# Patient Record
Sex: Male | Born: 1970 | Race: White | Hispanic: No | Marital: Married | Smoking: Former smoker
Health system: Southern US, Community
[De-identification: ages and names within clinical notes are randomized; demographics above are authoritative.]

## PROBLEM LIST (undated history)

## (undated) ENCOUNTER — Emergency Department (HOSPITAL_COMMUNITY): Admission: EM | Payer: Self-pay | Source: Home / Self Care

## (undated) DIAGNOSIS — F4310 Post-traumatic stress disorder, unspecified: Secondary | ICD-10-CM

## (undated) DIAGNOSIS — F32A Depression, unspecified: Secondary | ICD-10-CM

## (undated) DIAGNOSIS — F419 Anxiety disorder, unspecified: Secondary | ICD-10-CM

## (undated) DIAGNOSIS — R51 Headache: Secondary | ICD-10-CM

## (undated) DIAGNOSIS — F329 Major depressive disorder, single episode, unspecified: Secondary | ICD-10-CM

## (undated) DIAGNOSIS — R519 Headache, unspecified: Secondary | ICD-10-CM

## (undated) DIAGNOSIS — M549 Dorsalgia, unspecified: Secondary | ICD-10-CM

## (undated) HISTORY — DX: Headache: R51

## (undated) HISTORY — DX: Headache, unspecified: R51.9

## (undated) HISTORY — DX: Depression, unspecified: F32.A

## (undated) HISTORY — DX: Major depressive disorder, single episode, unspecified: F32.9

---

## 2012-08-29 ENCOUNTER — Other Ambulatory Visit (HOSPITAL_BASED_OUTPATIENT_CLINIC_OR_DEPARTMENT_OTHER): Payer: Self-pay | Admitting: Internal Medicine

## 2012-08-29 ENCOUNTER — Ambulatory Visit (HOSPITAL_BASED_OUTPATIENT_CLINIC_OR_DEPARTMENT_OTHER)
Admission: RE | Admit: 2012-08-29 | Discharge: 2012-08-29 | Disposition: A | Discharge status: Home / Self Care | Payer: Medicaid Other | Source: Ambulatory Visit | Attending: Internal Medicine | Admitting: Internal Medicine

## 2012-08-29 DIAGNOSIS — M545 Low back pain, unspecified: Secondary | ICD-10-CM | POA: Insufficient documentation

## 2012-08-29 DIAGNOSIS — M5410 Radiculopathy, site unspecified: Secondary | ICD-10-CM

## 2012-08-29 DIAGNOSIS — M5126 Other intervertebral disc displacement, lumbar region: Secondary | ICD-10-CM | POA: Insufficient documentation

## 2013-01-16 DIAGNOSIS — F4310 Post-traumatic stress disorder, unspecified: Secondary | ICD-10-CM | POA: Insufficient documentation

## 2013-01-16 DIAGNOSIS — F324 Major depressive disorder, single episode, in partial remission: Secondary | ICD-10-CM | POA: Insufficient documentation

## 2013-01-16 DIAGNOSIS — F411 Generalized anxiety disorder: Secondary | ICD-10-CM | POA: Insufficient documentation

## 2013-05-28 DIAGNOSIS — M545 Low back pain, unspecified: Secondary | ICD-10-CM | POA: Insufficient documentation

## 2013-08-13 DIAGNOSIS — R51 Headache: Secondary | ICD-10-CM

## 2013-08-13 DIAGNOSIS — E559 Vitamin D deficiency, unspecified: Secondary | ICD-10-CM | POA: Insufficient documentation

## 2013-08-13 DIAGNOSIS — G8929 Other chronic pain: Secondary | ICD-10-CM | POA: Insufficient documentation

## 2013-10-15 DIAGNOSIS — R7989 Other specified abnormal findings of blood chemistry: Secondary | ICD-10-CM | POA: Insufficient documentation

## 2013-10-15 DIAGNOSIS — E785 Hyperlipidemia, unspecified: Secondary | ICD-10-CM | POA: Insufficient documentation

## 2014-01-26 ENCOUNTER — Emergency Department (HOSPITAL_BASED_OUTPATIENT_CLINIC_OR_DEPARTMENT_OTHER)
Admission: EM | Admit: 2014-01-26 | Discharge: 2014-01-26 | Disposition: A | Discharge status: Home / Self Care | Payer: Medicaid Other | Attending: Emergency Medicine | Admitting: Emergency Medicine

## 2014-01-26 ENCOUNTER — Emergency Department (HOSPITAL_BASED_OUTPATIENT_CLINIC_OR_DEPARTMENT_OTHER): Payer: Medicaid Other

## 2014-01-26 ENCOUNTER — Encounter (HOSPITAL_BASED_OUTPATIENT_CLINIC_OR_DEPARTMENT_OTHER): Payer: Self-pay | Admitting: *Deleted

## 2014-01-26 DIAGNOSIS — M545 Low back pain, unspecified: Secondary | ICD-10-CM

## 2014-01-26 DIAGNOSIS — G8929 Other chronic pain: Secondary | ICD-10-CM | POA: Insufficient documentation

## 2014-01-26 DIAGNOSIS — N508 Other specified disorders of male genital organs: Secondary | ICD-10-CM | POA: Diagnosis not present

## 2014-01-26 DIAGNOSIS — M549 Dorsalgia, unspecified: Secondary | ICD-10-CM | POA: Diagnosis present

## 2014-01-26 DIAGNOSIS — IMO0002 Reserved for concepts with insufficient information to code with codable children: Secondary | ICD-10-CM

## 2014-01-26 DIAGNOSIS — Z8659 Personal history of other mental and behavioral disorders: Secondary | ICD-10-CM | POA: Diagnosis not present

## 2014-01-26 DIAGNOSIS — Z87891 Personal history of nicotine dependence: Secondary | ICD-10-CM | POA: Insufficient documentation

## 2014-01-26 HISTORY — DX: Anxiety disorder, unspecified: F41.9

## 2014-01-26 HISTORY — DX: Post-traumatic stress disorder, unspecified: F43.10

## 2014-01-26 HISTORY — DX: Dorsalgia, unspecified: M54.9

## 2014-01-26 LAB — URINALYSIS, ROUTINE W REFLEX MICROSCOPIC
Bilirubin Urine: NEGATIVE
Glucose, UA: NEGATIVE mg/dL
HGB URINE DIPSTICK: NEGATIVE
Ketones, ur: 15 mg/dL — AB
LEUKOCYTES UA: NEGATIVE
NITRITE: NEGATIVE
PROTEIN: NEGATIVE mg/dL
SPECIFIC GRAVITY, URINE: 1.025 (ref 1.005–1.030)
UROBILINOGEN UA: 0.2 mg/dL (ref 0.0–1.0)
pH: 6 (ref 5.0–8.0)

## 2014-01-26 MED ORDER — PREDNISONE 20 MG PO TABS: ORAL_TABLET | ORAL | Status: DC

## 2014-01-26 MED ORDER — OXYCODONE-ACETAMINOPHEN 5-325 MG PO TABS: 2.0000 | ORAL_TABLET | ORAL | Status: DC | PRN

## 2014-01-26 MED ORDER — OXYCODONE-ACETAMINOPHEN 5-325 MG PO TABS
2.0000 | ORAL_TABLET | Freq: Once | ORAL | Status: AC
  Administered 2014-01-26: 2 via ORAL
  Filled 2014-01-26: qty 2

## 2014-01-26 NOTE — ED Notes (Signed)
Pt reports hx of back pain- states has been worse for last few weeks and worse this am

## 2014-01-26 NOTE — ED Provider Notes (Signed)
CSN: [id number]     Arrival date & time 01/26/14  1315 History   First MD Initiated Contact with Patient 01/26/14 1346     Chief Complaint  Patient presents with  . Back Pain     (Consider location/radiation/quality/duration/timing/severity/associated sxs/prior Treatment) HPI   43 year old male with history of chronic back pain who presents complaining of back pain and scrotal pain. Patient reports he has always had chronic low back pain which seems to be worsening in the past few weeks. This morning he was awoke with intense pain from his low back radiates around his corns and now into his scrotum. Pain is severe, he was having difficulty getting out of bed, sitting position may have been worsening his pain. This pain is different from his usual pain. There is no associated fever, chills, nausea vomiting diarrhea, dysuria, hematuria, bowel bladder incontinence, saddle paresthesia, or rash denies any new numbness or weakness. Patient states he has been evaluated for his low back pain multiple times in the past including MRI and was told that he has degenerative disc and bulging disc. He is currently not on any medication for his back.  Past Medical History  Diagnosis Date  . Back pain   . Anxiety   . PTSD (post-traumatic stress disorder)    History reviewed. No pertinent past surgical history. No family history on file. History  Substance Use Topics  . Smoking status: Former Games developermoker  . Smokeless tobacco: Current User    Types: Snuff  . Alcohol Use: No    Review of Systems  Constitutional: Negative for fever.  Genitourinary: Negative for scrotal swelling.  Musculoskeletal: Positive for back pain.  Neurological: Negative for numbness.       Allergies  Review of patient's allergies indicates no known allergies.  Home Medications   Prior to Admission medications   Not on File   BP 128/77 mmHg  Pulse 97  Temp(Src) 98.1 F (36.7 C) (Oral)  Resp 20  Ht 6\' 1"  (1.854 m)   Wt 260 lb (117.935 kg)  BMI 34.31 kg/m2  SpO2 97% Physical Exam  Constitutional: He appears well-developed and well-nourished. No distress.  HENT:  Head: Atraumatic.  Eyes: Conjunctivae are normal.  Neck: Normal range of motion. Neck supple.  Cardiovascular: Intact distal pulses.   Abdominal: There is tenderness (guarding or rebound tenderness. Negative Murphy sign, no pain at McBurney's point.).  No cva tenderness  Genitourinary:  Chaperone present: No inguinal hernia, normal penile shaft with a circumcised penis without rash. Testicles are tender bilaterally without any significant swelling or mass. Testicle with normal lies.  Cremasteric reflex intact bilat.   No   Musculoskeletal: He exhibits tenderness (Tenderness to lumbar midline without crepitus or step-off. Paralumbar tenderness on palpation, no overlying skin changes.). He exhibits no edema.  Neurological: He is alert. He has normal reflexes.  Skin: No rash noted.  Psychiatric: He has a normal mood and affect.    ED Course  Procedures (including critical care time)  2:15 PM   Acute on chronic low back pain without red flags. No inguinal lymphadenopathy but patient does complain of pain to testicle bilaterally on palpation. Otherwise no suspicion for testicular torsion , plan to obtain a scrotal ultrasound to rule out acute emergent condition. X-ray of low back ordered, pain medication given.  3:57 PM  scrotal ultrasound without any acute finding , UA shows no evidence of urinary tract infection , L-spine x-ray is normal. Patient able to ambulate and no obvious red flags  as discussed earlier. He denies having any significant bowel bladder problem. At this time no suspicion for colitis, diverticulitis, appendicitis, or other acute emergent condition. I do not think MRI of his spine is appropriate at this time. Given that he described some radicular pain down to his left lower leg my plan is to provide him pain medication,  steroid, and a close follow-up with neurosurgeon for further care. Strict return precautions discussed.  Labs Review Labs Reviewed  URINALYSIS, ROUTINE W REFLEX MICROSCOPIC - Abnormal; Notable for the following:    Ketones, ur 15 (*)    All other components within normal limits    Imaging Review Dg Lumbar Spine Complete  01/26/2014   CLINICAL DATA:  Low back pain initiated for 1 day. Numbness going down the legs.  EXAM: LUMBAR SPINE - COMPLETE 4+ VIEW  COMPARISON:  MRI 08/29/2012  FINDINGS: Normal alignment of lumbar vertebral bodies. No loss of vertebral body height or disc height. No pars fracture. No subluxation.  IMPRESSION: Normal lumbar radiographs   Electronically Signed   By: Genevive Bi M.D.   On: 01/26/2014 14:29   US Scrotum  01/26/2014   CLINICAL DATA:  43 year old male with scrotal pain for 2 months.  EXAM: SCROTAL ULTRASOUND  DOPPLER ULTRASOUND OF THE TESTICLES  TECHNIQUE: Complete ultrasound examination of the testicles, epididymis, and other scrotal structures was performed. Color and spectral Doppler ultrasound were also utilized to evaluate blood flow to the testicles.  COMPARISON:  None.  FINDINGS: Right testicle  Measurements: 5 x 2.4 x 3.3 cm. No mass or microlithiasis visualized.  Left testicle  Measurements: 4.7 x 2.5 x 3.3 cm. No mass or microlithiasis visualized.  Right epididymis:  Normal in size and appearance.  Left epididymis:  A 5 x 7 mm epididymal cyst/ spermatocele is noted.  Hydrocele:  A small left hydrocele is noted.  Varicocele:  None visualized.  Pulsed Doppler interrogation of both testes demonstrates low resistance arterial and venous waveforms bilaterally.  IMPRESSION: Normal testicles.  No evidence of testicular torsion or mass.  Small left hydrocele.   Electronically Signed   By: Laveda Abbe M.D.   On: 01/26/2014 14:55   [place] Art/ven Flow Abd Pelv Doppler  01/26/2014   CLINICAL DATA:  43 year old male with scrotal pain for 2 months.  EXAM: SCROTAL  ULTRASOUND  DOPPLER ULTRASOUND OF THE TESTICLES  TECHNIQUE: Complete ultrasound examination of the testicles, epididymis, and other scrotal structures was performed. Color and spectral Doppler ultrasound were also utilized to evaluate blood flow to the testicles.  COMPARISON:  None.  FINDINGS: Right testicle  Measurements: 5 x 2.4 x 3.3 cm. No mass or microlithiasis visualized.  Left testicle  Measurements: 4.7 x 2.5 x 3.3 cm. No mass or microlithiasis visualized.  Right epididymis:  Normal in size and appearance.  Left epididymis:  A 5 x 7 mm epididymal cyst/ spermatocele is noted.  Hydrocele:  A small left hydrocele is noted.  Varicocele:  None visualized.  Pulsed Doppler interrogation of both testes demonstrates low resistance arterial and venous waveforms bilaterally.  IMPRESSION: Normal testicles.  No evidence of testicular torsion or mass.  Small left hydrocele.   Electronically Signed   By: Laveda Abbe M.D.   On: 01/26/2014 14:55     EKG Interpretation None      MDM   Final diagnoses:  Testicular/scrotal pain  Low back pain    BP 128/77 mmHg  Pulse 97  Temp(Src) 98.1 F (36.7 C) (Oral)  Resp 20  Ht 6\' 1"  (1.854 m)  Wt 260 lb (117.935 kg)  BMI 34.31 kg/m2  SpO2 97%  I have reviewed nursing notes and vital signs. I personally reviewed the imaging tests through PACS system  I reviewed available ER/hospitalization records thought the EMR     Fayrene HelperBowie Desma Wilkowski, PA-C 01/26/14 1608  Gwyneth SproutWhitney Plunkett, MD 02/03/14 2255

## 2014-01-26 NOTE — Discharge Instructions (Signed)

## 2014-01-30 ENCOUNTER — Emergency Department (HOSPITAL_BASED_OUTPATIENT_CLINIC_OR_DEPARTMENT_OTHER)
Admission: EM | Admit: 2014-01-30 | Discharge: 2014-01-30 | Disposition: A | Discharge status: Home / Self Care | Payer: Medicaid Other | Attending: Emergency Medicine | Admitting: Emergency Medicine

## 2014-01-30 ENCOUNTER — Encounter (HOSPITAL_BASED_OUTPATIENT_CLINIC_OR_DEPARTMENT_OTHER): Payer: Self-pay

## 2014-01-30 DIAGNOSIS — Z8659 Personal history of other mental and behavioral disorders: Secondary | ICD-10-CM | POA: Insufficient documentation

## 2014-01-30 DIAGNOSIS — M545 Low back pain, unspecified: Secondary | ICD-10-CM

## 2014-01-30 DIAGNOSIS — Z87891 Personal history of nicotine dependence: Secondary | ICD-10-CM | POA: Insufficient documentation

## 2014-01-30 DIAGNOSIS — Z7952 Long term (current) use of systemic steroids: Secondary | ICD-10-CM | POA: Diagnosis not present

## 2014-01-30 MED ORDER — OXYCODONE-ACETAMINOPHEN 5-325 MG PO TABS: 2.0000 | ORAL_TABLET | ORAL | Status: DC | PRN

## 2014-01-30 MED ORDER — OXYCODONE-ACETAMINOPHEN 5-325 MG PO TABS: 1.0000 | ORAL_TABLET | Freq: Four times a day (QID) | ORAL | Status: DC | PRN

## 2014-01-30 NOTE — ED Provider Notes (Signed)
CSN: [id number]     Arrival date & time 01/30/14  1548 History   First MD Initiated Contact with Patient 01/30/14 1604     No chief complaint on file.    (Consider location/radiation/quality/duration/timing/severity/associated sxs/prior Treatment) HPI Complains of low back pain radiating to his scrotum becoming worse 2 weeks ago. No injury no fever no loss of bladder or bowel control. Patient suffers from chronic back pain but has become worse over the past 2 weeks. Seen here on 01/26/2014 prescribed Percocet and prednisone. He has run out of Percocet he has one day of prednisone in tapered dose. Patient has follow-up appointment scheduled with neurosurgeon for Monday, 02/04/2014. Pain is worse with changing positions improved with standing straight. Patient's evaluation on 01/26/2014 consisted of plain radiographs of lumbar spine, scrotal ultrasound and urinalysis Past Medical History  Diagnosis Date  . Back pain   . Anxiety   . PTSD (post-traumatic stress disorder)    History reviewed. No pertinent past surgical history. No family history on file. History  Substance Use Topics  . Smoking status: Former Games developermoker  . Smokeless tobacco: Current User    Types: Chew  . Alcohol Use: No   no illicit drug use  Review of Systems  Musculoskeletal: Positive for back pain.  All other systems reviewed and are negative.     Allergies  Review of patient's allergies indicates no known allergies.  Home Medications   Prior to Admission medications   Medication Sig Start Date End Date Taking? Authorizing Provider  oxyCODONE-acetaminophen (PERCOCET/ROXICET) 5-325 MG per tablet Take 2 tablets by mouth every 4 (four) hours as needed for moderate pain or severe pain. 01/26/14   Fayrene HelperBowie Tran, PA-C  predniSONE (DELTASONE) 20 MG tablet 3 tabs po day one, then 2 po daily x 4 days 01/26/14   Fayrene HelperBowie Tran, PA-C   BP 139/96 mmHg  Pulse 100  Temp(Src) 97.8 F (36.6 C) (Oral)  Resp 18  Ht 6\' 1"  (1.854 m)   Wt 260 lb (117.935 kg)  BMI 34.31 kg/m2  SpO2 96% Physical Exam  Constitutional: He is oriented to person, place, and time. He appears well-developed and well-nourished. He appears distressed.  Appears uncomfortable  HENT:  Head: Normocephalic and atraumatic.  Eyes: Conjunctivae are normal. Pupils are equal, round, and reactive to light.  Neck: Neck supple. No tracheal deviation present. No thyromegaly present.  Cardiovascular: Normal rate and regular rhythm.   No murmur heard. Pulmonary/Chest: Effort normal and breath sounds normal.  Abdominal: Soft. Bowel sounds are normal. He exhibits no distension. There is no tenderness.  Musculoskeletal: Normal range of motion. He exhibits no edema or tenderness.  No point tenderness along spine. He does complain of pain at lower back when he stands up from a seated position.  Neurological: He is alert and oriented to person, place, and time. No cranial nerve deficit. Coordination normal.  Gait normal. DTRs symmetric bilaterally at knee jerk ankle jerk biceps disorder bilaterally  Skin: Skin is warm and dry. No rash noted.  Psychiatric: He has a normal mood and affect.  Nursing note and vitals reviewed.   ED Course  Procedures (including critical care time) Labs Review Labs Reviewed - No data to display  Imaging Review No results found.   EKG Interpretation None      MDM  Plan patient does not require further doses of steroid. Prescription for Percocet. He is to keep his scheduled plan with neurosurgeon on 02/04/2014. Patient has chronic back pain with acute on chronic  component Diagnosis low back pain Final diagnoses:  None        Doug SouSam Blenda Wisecup, MD 01/30/14 [phone]

## 2014-01-30 NOTE — ED Notes (Signed)
C/o cont'd lower back pain x 2 weeks-denies injury-seen here 11/21-out of meds-has appt with neuro next Monday

## 2014-01-30 NOTE — Discharge Instructions (Signed)
Keep your scheduled appointment with neurosurgeon on 02/04/2014. Take her Advil for mild pain or the pain medicine prescribed for bad pain

## 2014-05-06 ENCOUNTER — Emergency Department (HOSPITAL_BASED_OUTPATIENT_CLINIC_OR_DEPARTMENT_OTHER)
Admission: EM | Admit: 2014-05-06 | Discharge: 2014-05-07 | Disposition: A | Discharge status: Home / Self Care | Payer: Medicaid Other | Attending: Emergency Medicine | Admitting: Emergency Medicine

## 2014-05-06 ENCOUNTER — Encounter (HOSPITAL_BASED_OUTPATIENT_CLINIC_OR_DEPARTMENT_OTHER): Payer: Self-pay | Admitting: *Deleted

## 2014-05-06 DIAGNOSIS — M545 Low back pain: Secondary | ICD-10-CM | POA: Diagnosis not present

## 2014-05-06 DIAGNOSIS — Z87891 Personal history of nicotine dependence: Secondary | ICD-10-CM | POA: Insufficient documentation

## 2014-05-06 DIAGNOSIS — R109 Unspecified abdominal pain: Secondary | ICD-10-CM | POA: Insufficient documentation

## 2014-05-06 DIAGNOSIS — R52 Pain, unspecified: Secondary | ICD-10-CM

## 2014-05-06 NOTE — ED Notes (Signed)
Pt c/o lower back pain which radiates down left leg x 1 day

## 2014-05-07 ENCOUNTER — Emergency Department (HOSPITAL_BASED_OUTPATIENT_CLINIC_OR_DEPARTMENT_OTHER): Payer: Medicaid Other

## 2014-05-07 LAB — URINALYSIS, ROUTINE W REFLEX MICROSCOPIC
GLUCOSE, UA: NEGATIVE mg/dL
Hgb urine dipstick: NEGATIVE
KETONES UR: 15 mg/dL — AB
Leukocytes, UA: NEGATIVE
Nitrite: NEGATIVE
PH: 5.5 (ref 5.0–8.0)
Protein, ur: NEGATIVE mg/dL
Specific Gravity, Urine: 1.026 (ref 1.005–1.030)
Urobilinogen, UA: 0.2 mg/dL (ref 0.0–1.0)

## 2014-05-07 MED ORDER — OXYCODONE-ACETAMINOPHEN 5-325 MG PO TABS: 1.0000 | ORAL_TABLET | Freq: Four times a day (QID) | ORAL | Status: DC | PRN

## 2014-05-07 MED ORDER — ONDANSETRON HCL 4 MG/2ML IJ SOLN
4.0000 mg | Freq: Once | INTRAMUSCULAR | Status: AC
  Administered 2014-05-07: 4 mg via INTRAMUSCULAR
  Filled 2014-05-07: qty 2

## 2014-05-07 MED ORDER — HYDROMORPHONE HCL 1 MG/ML IJ SOLN
2.0000 mg | Freq: Once | INTRAMUSCULAR | Status: AC
  Administered 2014-05-07: 2 mg via INTRAMUSCULAR
  Filled 2014-05-07: qty 2

## 2014-05-07 MED ORDER — HYDROXYZINE HCL 25 MG PO TABS
25.0000 mg | ORAL_TABLET | Freq: Once | ORAL | Status: AC
  Administered 2014-05-07: 25 mg via ORAL

## 2014-05-07 MED ORDER — HYDROXYZINE HCL 10 MG PO TABS
10.0000 mg | ORAL_TABLET | Freq: Once | ORAL | Status: DC
  Filled 2014-05-07: qty 1

## 2014-05-07 MED ORDER — HYDROXYZINE HCL 25 MG PO TABS
ORAL_TABLET | ORAL | Status: AC
  Filled 2014-05-07: qty 1

## 2014-05-07 NOTE — ED Provider Notes (Signed)
Nursing notes and vitals signs, including pulse oximetry, reviewed.  Summary of this visit's results, reviewed by myself:  Labs:  Results for orders placed or performed during the hospital encounter of 05/06/14 (from the past 24 hour(s))  Urinalysis, Routine w reflex microscopic     Status: Abnormal   Collection Time: 05/07/14 12:18 AM  Result Value Ref Range   Color, Urine AMBER (A) YELLOW   APPearance CLOUDY (A) CLEAR   Specific Gravity, Urine 1.026 1.005 - 1.030   pH 5.5 5.0 - 8.0   Glucose, UA NEGATIVE NEGATIVE mg/dL   Hgb urine dipstick NEGATIVE NEGATIVE   Bilirubin Urine SMALL (A) NEGATIVE   Ketones, ur 15 (A) NEGATIVE mg/dL   Protein, ur NEGATIVE NEGATIVE mg/dL   Urobilinogen, UA 0.2 0.0 - 1.0 mg/dL   Nitrite NEGATIVE NEGATIVE   Leukocytes, UA NEGATIVE NEGATIVE    Imaging Studies: Ct Renal Stone Study  05/07/2014   CLINICAL DATA:  Lower back pain radiating down the left leg for 1 day.  EXAM: CT ABDOMEN AND PELVIS WITHOUT CONTRAST  TECHNIQUE: Multidetector CT imaging of the abdomen and pelvis was performed following the standard protocol without IV contrast.  COMPARISON:  None.  FINDINGS: BODY WALL: Medication injection into the left gluteal fat.  LOWER CHEST: Unremarkable.  ABDOMEN/PELVIS:  Liver: No focal abnormality.  Biliary: No evidence of biliary obstruction or stone.  Pancreas: Unremarkable.  Spleen: Unremarkable.  Adrenals: Unremarkable.  Kidneys and ureters: No hydronephrosis or stone.  Bladder: Unremarkable.  Reproductive: Unremarkable.  Bowel: No obstruction. Normal appendix.  Retroperitoneum: No mass or adenopathy.  Peritoneum: No ascites or pneumoperitoneum.  Vascular: No acute abnormality.  OSSEOUS: Degenerative disc narrowing and L5-S1. Compared to 08/29/2012 MRI, no new spinal findings to explain back pain.  IMPRESSION: No hydronephrosis, urolithiasis, or other explanation for acute back pain.   Electronically Signed   By: Marnee SpringJonathon  Watts M.D.   On: 05/07/2014 01:18     Medical screening examination/treatment/procedure(s) were conducted as a shared visit with non-physician practitioner(s) and myself.  I personally evaluated the patient during the encounter.  1:25 AM Pain improved after medications. Patient pacing but in no distress. Advised of CT findings, need to follow up with PCP.     Hanley SeamenJohn L Avelynn Sellin, MD 05/07/14 [phone]

## 2014-05-07 NOTE — ED Provider Notes (Signed)
CSN: [id number]     Arrival date & time 05/06/14  2229 History   First MD Initiated Contact with Patient 05/06/14 2350     Chief Complaint  Patient presents with  . Back Pain     (Consider location/radiation/quality/duration/timing/severity/associated sxs/prior Treatment) Patient is a 44 y.o. male presenting with back pain. The history is provided by the patient. No language interpreter was used.  Back Pain Location:  Lumbar spine Quality:  Aching Radiates to:  Does not radiate Pain severity:  Moderate Pain is:  Worse during the night Onset quality:  Gradual Duration:  1 day Timing:  Constant Progression:  Worsening Chronicity:  New Relieved by:  Nothing Worsened by:  Nothing tried Ineffective treatments:  None tried Associated symptoms: abdominal pain   Associated symptoms: no numbness   Pt reports he has had back pain since his youth.  Pt complains of an exacerbation of pain.  Pt reports he saw neurosurgeon and was told it was not his back but is awaiting MRi. Urologist reported not prostate  Past Medical History  Diagnosis Date  . Back pain   . Anxiety   . PTSD (post-traumatic stress disorder)    History reviewed. No pertinent past surgical history. History reviewed. No pertinent family history. History  Substance Use Topics  . Smoking status: Former Games developermoker  . Smokeless tobacco: Current User    Types: Chew  . Alcohol Use: No    Review of Systems  Gastrointestinal: Positive for abdominal pain.  Musculoskeletal: Positive for back pain.  Neurological: Negative for numbness.  All other systems reviewed and are negative.     Allergies  Review of patient's allergies indicates no known allergies.  Home Medications   Prior to Admission medications   Not on File   BP 111/78 mmHg  Pulse 104  Temp(Src) 98.8 F (37.1 C) (Oral)  Resp 16  Ht 6' (1.829 m)  Wt 260 lb (117.935 kg)  BMI 35.25 kg/m2  SpO2 98% Physical Exam  Constitutional: He appears  well-developed and well-nourished.  HENT:  Head: Normocephalic.  Right Ear: External ear normal.  Left Ear: External ear normal.  Eyes: Pupils are equal, round, and reactive to light.  Neck: Normal range of motion.  Cardiovascular: Normal rate.   Pulmonary/Chest: Effort normal.  Abdominal: Soft.  Musculoskeletal: He exhibits tenderness.  Tender ls spine   Neurological: He is alert.  Skin: Skin is warm.  Nursing note and vitals reviewed.   ED Course  Procedures (including critical care time) Labs Review Labs Reviewed  URINALYSIS, ROUTINE W REFLEX MICROSCOPIC - Abnormal; Notable for the following:    Color, Urine AMBER (*)    APPearance CLOUDY (*)    Bilirubin Urine SMALL (*)    Ketones, ur 15 (*)    All other components within normal limits    Imaging Review No results found.   EKG Interpretation None      MDM   Final diagnoses:  Pain        Elson AreasLeslie K Carolin Quang, PA-C 05/07/14 0845

## 2014-05-20 ENCOUNTER — Emergency Department (HOSPITAL_BASED_OUTPATIENT_CLINIC_OR_DEPARTMENT_OTHER): Payer: Medicaid Other

## 2014-05-20 ENCOUNTER — Emergency Department (HOSPITAL_BASED_OUTPATIENT_CLINIC_OR_DEPARTMENT_OTHER)
Admission: EM | Admit: 2014-05-20 | Discharge: 2014-05-21 | Disposition: A | Discharge status: Home / Self Care | Payer: Medicaid Other | Attending: Emergency Medicine | Admitting: Emergency Medicine

## 2014-05-20 ENCOUNTER — Encounter (HOSPITAL_BASED_OUTPATIENT_CLINIC_OR_DEPARTMENT_OTHER): Payer: Self-pay | Admitting: Emergency Medicine

## 2014-05-20 DIAGNOSIS — R109 Unspecified abdominal pain: Secondary | ICD-10-CM | POA: Insufficient documentation

## 2014-05-20 DIAGNOSIS — M549 Dorsalgia, unspecified: Secondary | ICD-10-CM | POA: Insufficient documentation

## 2014-05-20 DIAGNOSIS — G8929 Other chronic pain: Secondary | ICD-10-CM | POA: Diagnosis not present

## 2014-05-20 DIAGNOSIS — Z8659 Personal history of other mental and behavioral disorders: Secondary | ICD-10-CM | POA: Insufficient documentation

## 2014-05-20 DIAGNOSIS — Z87891 Personal history of nicotine dependence: Secondary | ICD-10-CM | POA: Insufficient documentation

## 2014-05-20 MED ORDER — DICYCLOMINE HCL 10 MG/ML IM SOLN
20.0000 mg | Freq: Once | INTRAMUSCULAR | Status: AC
  Administered 2014-05-20: 20 mg via INTRAMUSCULAR
  Filled 2014-05-20: qty 2

## 2014-05-20 MED ORDER — GI COCKTAIL ~~LOC~~
30.0000 mL | Freq: Once | ORAL | Status: AC
  Administered 2014-05-20: 30 mL via ORAL
  Filled 2014-05-20: qty 30

## 2014-05-20 MED ORDER — KETOROLAC TROMETHAMINE 60 MG/2ML IM SOLN
60.0000 mg | Freq: Once | INTRAMUSCULAR | Status: AC
  Administered 2014-05-20: 60 mg via INTRAMUSCULAR
  Filled 2014-05-20: qty 2

## 2014-05-20 NOTE — ED Notes (Signed)
Pt states he has been having abd pain and back pain for 20 years and no one can figure out what it is.  Pt states that it hurts worse today then other days.

## 2014-05-20 NOTE — ED Provider Notes (Signed)
CSN: [id number]     Arrival date & time 05/20/14  1835 History  This chart was scribed for Marlissa Emerick, MD by Freida Busman, ED Scribe. This patient was seen in room MH03/MH03 and the patient's care was started 11:33 PM.    Chief Complaint  Patient presents with  . Abdominal Pain    Patient is a 44 y.o. male presenting with abdominal pain and back pain. The history is provided by the patient. No language interpreter was used.  Abdominal Pain Pain location:  Suprapubic Pain quality: cramping   Pain radiates to:  Back Pain severity:  Severe Onset quality:  Gradual Duration: 20 years. Timing:  Constant Progression:  Waxing and waning Chronicity:  Recurrent Context: not alcohol use   Relieved by:  Nothing Worsened by:  Nothing tried Ineffective treatments:  None tried Associated symptoms: no chills, no constipation, no fever, no nausea and no vomiting  Diarrhea: Chronic.   Risk factors: no alcohol abuse   Back Pain Location:  Lumbar spine Pain severity:  Moderate (Excruciating) Chronicity:  Chronic Context: not recent injury   Relieved by:  Nothing Associated symptoms: abdominal pain   Associated symptoms: no bladder incontinence, no bowel incontinence, no fever, no numbness and no weakness      HPI Comments:  Peter Foster is a 44 y.o. male with a h/o chronic back pain who presents to the Emergency Department complaining of excruciating lower abdominal pain that radiates into his back. Pt has a h/o same pain; he was seen in the ED 2 weeks ago for the same  And discharged after pain improved. No alleviating factors noted since discharge. Pt notes he is currently out of pain meds.   PCP at [facility]; states he was referred to Urologist who cancelled his appointment  Past Medical History  Diagnosis Date  . Back pain   . Anxiety   . PTSD (post-traumatic stress disorder)    History reviewed. No pertinent past surgical history. History reviewed. No pertinent family  history. History  Substance Use Topics  . Smoking status: Former Games developer  . Smokeless tobacco: Current User    Types: Chew  . Alcohol Use: No    Review of Systems  Constitutional: Negative for fever and chills.  Gastrointestinal: Positive for abdominal pain. Negative for nausea, vomiting, constipation and bowel incontinence. Diarrhea: Chronic.  Genitourinary: Negative for bladder incontinence and difficulty urinating.  Musculoskeletal: Positive for back pain.  Neurological: Negative for weakness and numbness.  All other systems reviewed and are negative.     Allergies  Review of patient's allergies indicates no known allergies.  Home Medications   Prior to Admission medications   Medication Sig Start Date End Date Taking? Authorizing Provider  oxyCODONE-acetaminophen (PERCOCET/ROXICET) 5-325 MG per tablet Take 1-2 tablets by mouth every 6 (six) hours as needed for severe pain (for pain). 05/07/14   John Molpus, MD   BP 131/81 mmHg  Pulse 104  Temp(Src) 98.4 F (36.9 C) (Oral)  Resp 16  SpO2 98% Physical Exam  Constitutional: He is oriented to person, place, and time. He appears well-developed and well-nourished. No distress.  HENT:  Head: Normocephalic and atraumatic.  Mouth/Throat: Oropharynx is clear and moist.  Eyes: Conjunctivae are normal. Pupils are equal, round, and reactive to light.  Neck: Normal range of motion. Neck supple.  Cardiovascular: Normal rate, regular rhythm and normal heart sounds.   Pulmonary/Chest: Effort normal and breath sounds normal. No respiratory distress.  Abdominal: Soft. He exhibits no distension. There is no  tenderness. There is no rebound and no guarding.  Hyperactive bowel sounds  Musculoskeletal: Normal range of motion.  Neurological: He is alert and oriented to person, place, and time.  Skin: Skin is warm and dry.  Psychiatric: He has a normal mood and affect. His behavior is normal.  Nursing note and vitals reviewed.   ED Course   Procedures   DIAGNOSTIC STUDIES:  Oxygen Saturation is 98% on RA, normal by my interpretation.    COORDINATION OF CARE:  11:42 PM Discussed treatment plan with pt at bedside and pt agreed to plan.  Labs Review Labs Reviewed  URINALYSIS, ROUTINE W REFLEX MICROSCOPIC    Imaging Review No results found.   EKG Interpretation None      MDM   Final diagnoses:  None    Medications  ketorolac (TORADOL) injection 60 mg (60 mg Intramuscular Given 05/20/14 2356)  gi cocktail (Maalox,Lidocaine,Donnatal) (30 mLs Oral Given 05/20/14 2356)  dicyclomine (BENTYL) injection 20 mg (20 mg Intramuscular Given 05/20/14 2356)  methocarbamol (ROBAXIN) tablet 1,000 mg (1,000 mg Oral Given 05/21/14 0130)  oxyCODONE-acetaminophen (PERCOCET/ROXICET) 5-325 MG per tablet 1 tablet (1 tablet Oral Given 05/21/14 0130)   Chronic pain, seen 2 weeks ago and had full work up.  We had a long discussion about how patient needs to follow up.  He has not yet seen urology or spine as directed.  Patient originally denied IBS now admits to IBS.  Symptoms have not changed since previous CT.  Patient needs to follow up with PMD and specialists for chronic pain.     I personally performed the services described in this documentation, which was scribed in my presence. The recorded information has been reviewed and is accurate.     Cy BlamerApril Camauri Fleece, MD 05/21/14 [phone]

## 2014-05-21 ENCOUNTER — Encounter (HOSPITAL_BASED_OUTPATIENT_CLINIC_OR_DEPARTMENT_OTHER): Payer: Self-pay | Admitting: Emergency Medicine

## 2014-05-21 LAB — URINALYSIS, ROUTINE W REFLEX MICROSCOPIC
Bilirubin Urine: NEGATIVE
Glucose, UA: NEGATIVE mg/dL
HGB URINE DIPSTICK: NEGATIVE
Ketones, ur: 15 mg/dL — AB
Leukocytes, UA: NEGATIVE
NITRITE: NEGATIVE
PROTEIN: NEGATIVE mg/dL
Specific Gravity, Urine: 1.023 (ref 1.005–1.030)
UROBILINOGEN UA: 0.2 mg/dL (ref 0.0–1.0)
pH: 5.5 (ref 5.0–8.0)

## 2014-05-21 MED ORDER — MELOXICAM 7.5 MG PO TABS
7.5000 mg | ORAL_TABLET | Freq: Every day | ORAL | Status: DC
Start: 2014-05-21 — End: 2018-01-09

## 2014-05-21 MED ORDER — DICYCLOMINE HCL 20 MG PO TABS: 20.0000 mg | ORAL_TABLET | Freq: Two times a day (BID) | ORAL | Status: DC

## 2014-05-21 MED ORDER — OXYCODONE-ACETAMINOPHEN 5-325 MG PO TABS
1.0000 | ORAL_TABLET | Freq: Once | ORAL | Status: AC
  Administered 2014-05-21: 1 via ORAL
  Filled 2014-05-21: qty 1

## 2014-05-21 MED ORDER — METHOCARBAMOL 500 MG PO TABS
1000.0000 mg | ORAL_TABLET | Freq: Once | ORAL | Status: AC
  Administered 2014-05-21: 1000 mg via ORAL
  Filled 2014-05-21: qty 2

## 2014-05-21 NOTE — Discharge Instructions (Signed)
°Emergency Department Resource Guide °1) Find a Doctor and Pay Out of Pocket °Although you won't have to find out who is covered by your insurance plan, it is a good idea to ask around and get recommendations. You will then need to call the office and see if the doctor you have chosen will accept you as a new patient and what types of options they offer for patients who are self-pay. Some doctors offer discounts or will set up payment plans for their patients who do not have insurance, but you will need to ask so you aren't surprised when you get to your appointment. ° °2) Contact Your Local Health Department °Not all health departments have doctors that can see patients for sick visits, but many do, so it is worth a call to see if yours does. If you don't know where your local health department is, you can check in your phone book. The CDC also has a tool to help you locate your state's health department, and many state websites also have listings of all of their local health departments. ° °3) Find a Walk-in Clinic °If your illness is not likely to be very severe or complicated, you may want to try a walk in clinic. These are popping up all over the country in pharmacies, drugstores, and shopping centers. They're usually staffed by nurse practitioners or physician assistants that have been trained to treat common illnesses and complaints. They're usually fairly quick and inexpensive. However, if you have serious medical issues or chronic medical problems, these are probably not your best option. ° °No Primary Care Doctor: °- Call [facility] at  832-8000 - they can help you locate a primary care doctor that  accepts your insurance, provides certain services, etc. °- Physician Referral Service- 1-800-533-3463 ° °Chronic Pain Problems: °Organization         Address  Phone   Notes  °[facility]  (336) 297-2271 Patients need to be referred by their primary care doctor.  ° °Medication  Assistance: °Organization         Address  Phone   Notes  °[place] [facility] [address] °[place], [place] [address] (336) 641-8030 --Must be a resident of [place] °-- Must have NO insurance coverage whatsoever (no Medicaid/ Medicare, etc.) °-- The pt. MUST have a primary care doctor that directs their care regularly and follows them in the community °  °[facility]  (866) 331-1348   °[employer]  (888) 892-1162   ° °Agencies that provide inexpensive medical care: °Organization         Address  Phone   Notes  °[facility]  (336) 832-8035   °[facility]    (336) 832-7272   °[facility] [address] °[place], [place] [address] (336) 832-4777   °[facility] [address], °[place] (336) 271-4999   °[facility]    (336) 373-0678   °[facility]    (336) 272-1050   °[facility] ° [address], [place] Phone:  (336) 832-4444, Fax:  (336) 832-4440 Hours of Operation:  9 am - 6 pm, M-F.  Also accepts Medicaid/Medicare and self-pay.  °[facility] ° [address], [place] Phone: (336) [phone], Fax: (336) [phone]. Hours of Operation:  8:30 am - 5:30 pm, M-F.  Also accepts Medicaid and self-pay.  °[facility] [place] [address], [place] Phone: (336) 878-6027   °[facility] [address], [place], [place] (336)723-1848, Ext. 123 Mondays & Thursdays: 7-9 AM.  First 15 patients are seen on a first come, first serve basis. °  ° °Medicaid-accepting [place] Providers: ° °Organization         Address  Phone   Notes  °[facility] [address], [place] (336) 641-2100 Also accepts self-pay patients.  °[facility] [address], [place] ° (336) 856-9996   °[facility] [address], [place]  (336) 288-8857   °[facility] [address] [place] [address], [place] (336) 299-7000   °Veita Bland [address], [place]  ° (336) 373-1557 Only accepts [place] Access Medicaid patients after they have their name applied to their card.  ° °Self-Pay (no insurance) in [place]: ° °Organization         Address  Phone   Notes  °Sickle Cell Patients, [facility] [address], [place] (336) 832-1970   °[facility] [address], [place] (336) 832-4400   °[facility] ° [address], [place] (336) 992-4800   °[facility]/Dr. Osei-Bonsu ° [address] [place] [address], [place] or [address], [place] (336) 841-8500 Phone number for both [place] and [employer] locations is the same.  °[facility] [address], [place] (336) 299-0000   °[facility] [address] [place] [address], [place] or [address] (336) 852-7530 °(336) 878-2260   °[facility] [address], [place] (336) 350-1642, phone; (336) 294-5005, fax Sees patients 1st and 3rd Saturday of every month.  Must not qualify for public or private insurance (i.e. Medicaid, Medicare, [place] Health Choice, Veterans' Benefits) • Household income should be no more than 200% of the poverty level •The clinic cannot treat you if you are pregnant or think you are pregnant • Sexually transmitted diseases are not treated at the clinic.  ° ° °Dental Care: °Organization         Address  Phone  Notes  °[place] [facility] [address], [place] (336) 641-6152 Accepts children up to age 21 who are enrolled in Medicaid or [place] Health Choice; pregnant women with a Medicaid card; and children who have applied for Medicaid or [place] Health Choice, but were declined, whose parents can pay a reduced fee at time of service.  °[place]  [facility] [place]  [address], [place] (336) 641-7733 Accepts children up to age 21 who are enrolled in Medicaid or [place] Health Choice; pregnant women with a Medicaid card; and children who have applied for Medicaid or [place] Health Choice, but were declined, whose parents can pay a reduced fee at time of service.  °[facility] ° [address], [place] (336) 641-4533 Patients are seen by appointment only. Walk-ins are not accepted. [facility] will see patients 18 years of age and older. °Monday - Tuesday (8am-5pm) °Most Wednesdays (8:30-5pm) °$30 per visit, cash only  °[facility] ° [address], [place] (336) 641-4533 Patients are seen by appointment only. Walk-ins are not accepted. [facility] will see patients 18 years of age and older. °One   Wednesday Evening (Monthly: Volunteer Based).  $30 per visit, cash only  °[facility]  (919) 537-3737 for adults; Children under age 4, call Graduate Pediatric Dentistry at ([phone]) [phone]. Children aged 4-14, please call ([phone]) [phone] to request a pediatric application. ° Dental services are provided in all areas of dental care including fillings, crowns and bridges, complete and partial dentures, implants, gum treatment, root canals, and extractions. Preventive care is also provided. Treatment is provided to both adults and children. °Patients are selected via a lottery and there is often a waiting list. °  °[facility] [address], °[place] ° (336) 763-8833 [email or web address] °  °[facility] [address], [place], [place] (336)723-1848, Ext. 123 Second and Fourth Thursday of each month, opens at 6:30 AM; Clinic ends at 9 AM.  Patients are seen on a first-come first-served basis, and a limited number are seen during each clinic.  ° °[facility] ° [address], [place], [place] (336) 723-7904    Eligibility Requirements °You must have lived in [place], [place], or [place] counties for at least the last three months. °  You cannot be eligible for state or federal sponsored healthcare insurance, including Veterans Administration, Medicaid, or Medicare. °  You generally cannot be eligible for healthcare insurance through your employer.  °  How to apply: °Eligibility screenings are held every Tuesday and Wednesday afternoon from 1:00 pm until 4:00 pm. You do not need an appointment for the interview!  °[facility] [address], [place], [place] 336-631-2330   °[facility]  336-342-8273   °[facility]  336-703-3100   °[facility]  336-570-6415   ° °Behavioral Health Resources in the Community: °Intensive Outpatient Programs °Organization         Address  Phone  Notes  °[facility] [address], [place], [place] 336-878-6098   °[facility] [address], [place], [place] 336-832-9800   °[facility] [address], [place], [place] ° 336-882-2125   °[facility] [address],  °[place], [place] 1-800-853-5163 or 336-641-4981   °Substance Abuse Resources °Organization         Address  Phone  Notes  °[facility]  336-882-2125   °[facility]  336-784-9470   °[facility]  336-285-9073   °[facility]  336-845-3988   °Residential & Outpatient Substance Abuse Program  1-800-659-3381   °Psychological Services °Organization         Address  Phone  Notes  °[facility]  336- 832-9600   °[facility]  336- 378-7881   °[facility] [address], [place] 1-800-853-5163 or 336-641-4981   ° °Mobile Crisis Teams °Organization         Address  Phone  Notes  °[facility], Mobile Crisis Care Unit  1-877-626-1772   °Assertive °[facility] ° [address]  [place], [place] 336-834-9664   °Sharon DeEsch [address] °[place]  336-554-5454   ° °Self-Help/Support Groups °Organization         Address  Phone             Notes  °[facility]  - variety of support groups  336- 373-1402 Call for more information  °Narcotics Anonymous (NA), [facility] [address], °[place]   2 meetings at this location  ° °  Residential Treatment Programs °Organization         Address  Phone  Notes  °[facility] [address],    °[place]  1-866-801-8205   °[facility] ° [address], [place], [place] 704-293-8524   °[facility] [address], [place] 336-845-3988 Admissions: 8am-3pm M-F  °[facility] [address]    °[place], [place] 336-841-1104   °[facility] [address], [place], [place] 336-379-7146   °[facility] [address]  °[place], [place] 336-285-9073   °[facility] - Intensive Outpatient [address], [place], [place] 336-852-3033   °[facility] [address]  °[place], [place] 1-877-615-2722 or 336-784-9470   °[facility] [address] [place], [place] 336-227-7417 Accepts Medicaid  °[facility] [address]  °[place] 1-800-659-3381 Substance Abuse/Addiction Treatment  ° °[place] Behavioral Health Resources °Organization         Address  Phone  Notes  °[facility]  (888) 581-9988   °Julie Brannon, PhD [address] [place], [place]   (336) 349-5553 or (336) 951-0000   °[facility]   [address] °[place], [place] (336) 349-4454   °[facility] [address], [place], [place] (336) 342-8316 Insurance/Medicaid/sponsorship through [facility]  °[facility] [address]                                    [place], [place] (336) 342-8316 Therapy/tele-psych/case    °[facility] [address]  ° [place], [place] (336) 349-2233    °Dr. Arfeen  (336) 349-4544   °[facility] [place]  [employer] [place] [facility] 1) [address], [place] °2) [address], [place] °3)  [address], [place] (336) 349-3220 °(336) 342-7768 ° °(336) 342-8140   °[place] [facility] (336) 342-1394 or (336) 342-3537 (After Hours)    ° ° °

## 2015-12-13 IMAGING — CR DG LUMBAR SPINE COMPLETE 4+V
5 series · 5 of 5 positions shown · non-contrast
Comparison: MRI 08/29/2012

CLINICAL DATA: Low back pain initiated for 1 day. Numbness going
down the legs.

EXAM:
LUMBAR SPINE - COMPLETE 4+ VIEW

[t l-spine a.p.]
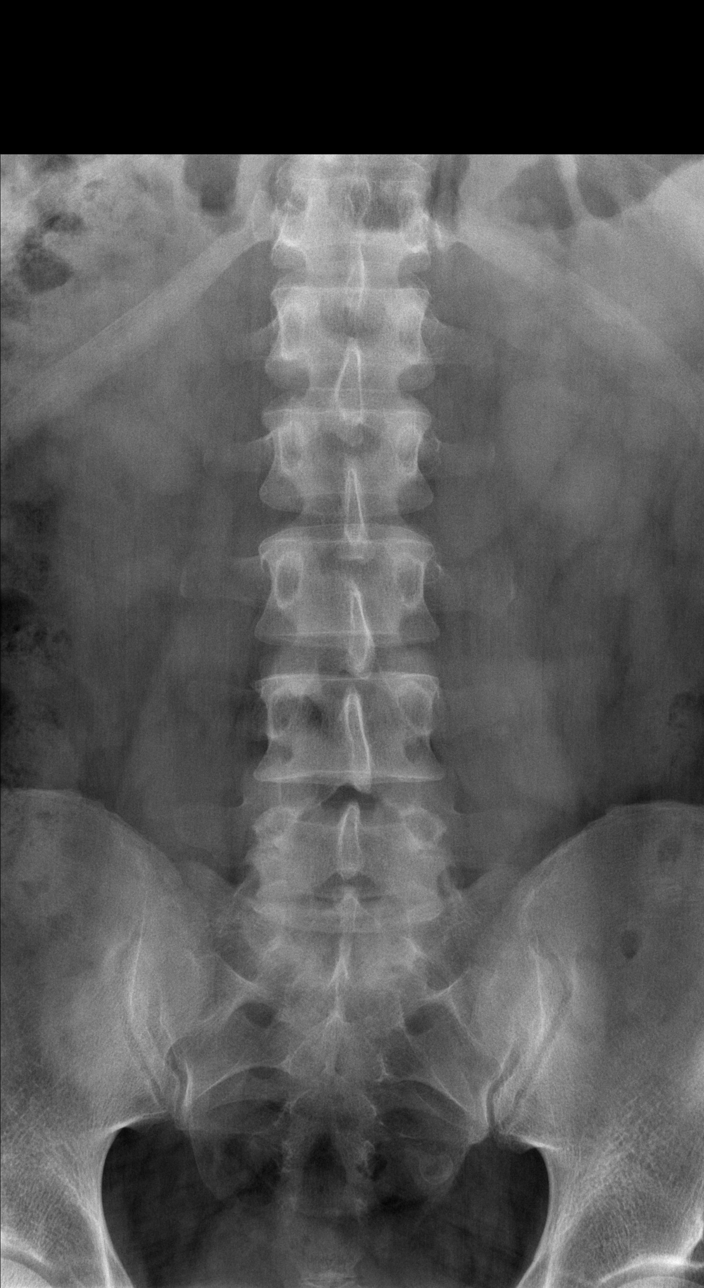

[t l-spine oblique exposure (1 of 2)]
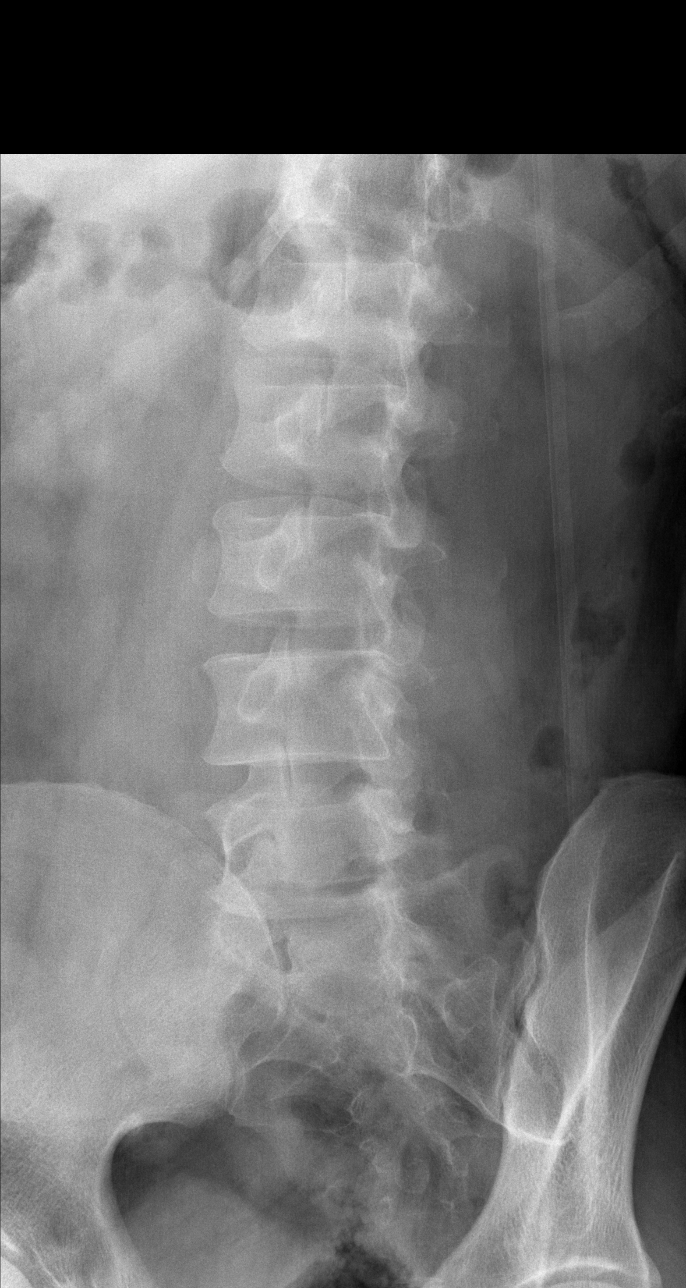

[t l-spine oblique exposure (2 of 2)]
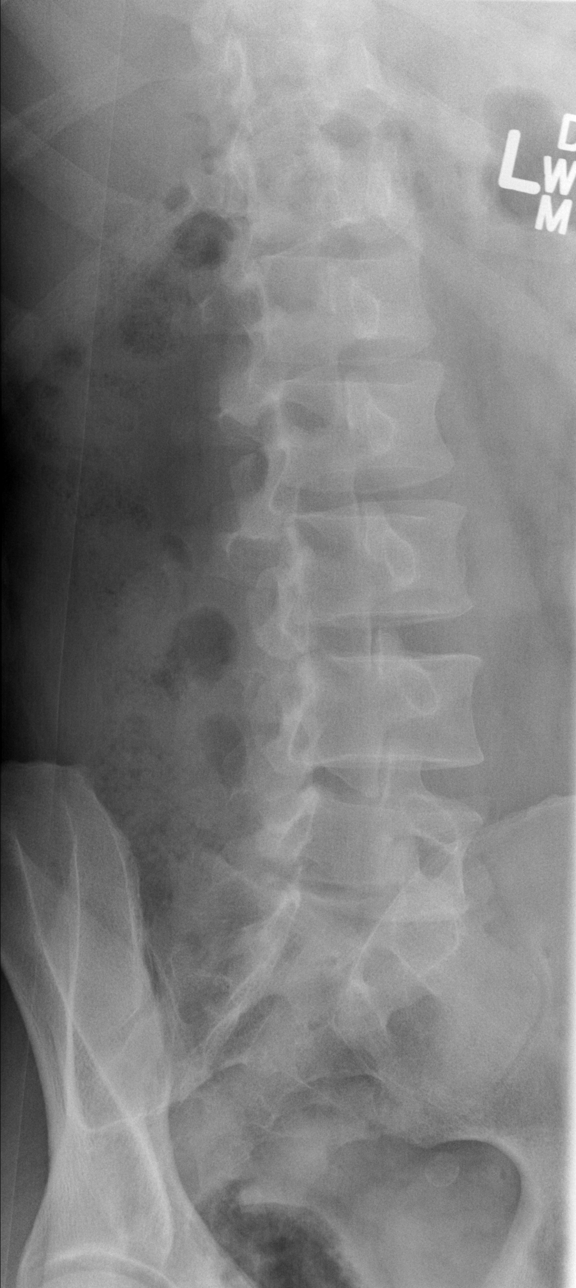

[t l-spine lat]
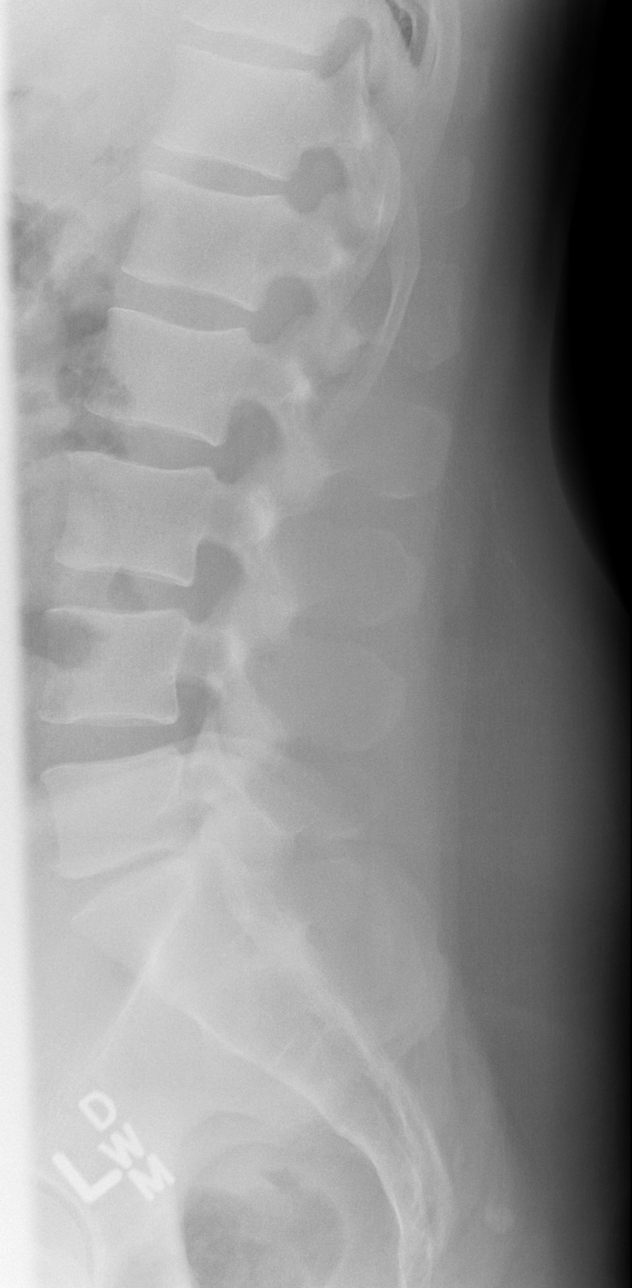

[t l-spine l5-s1 spot]
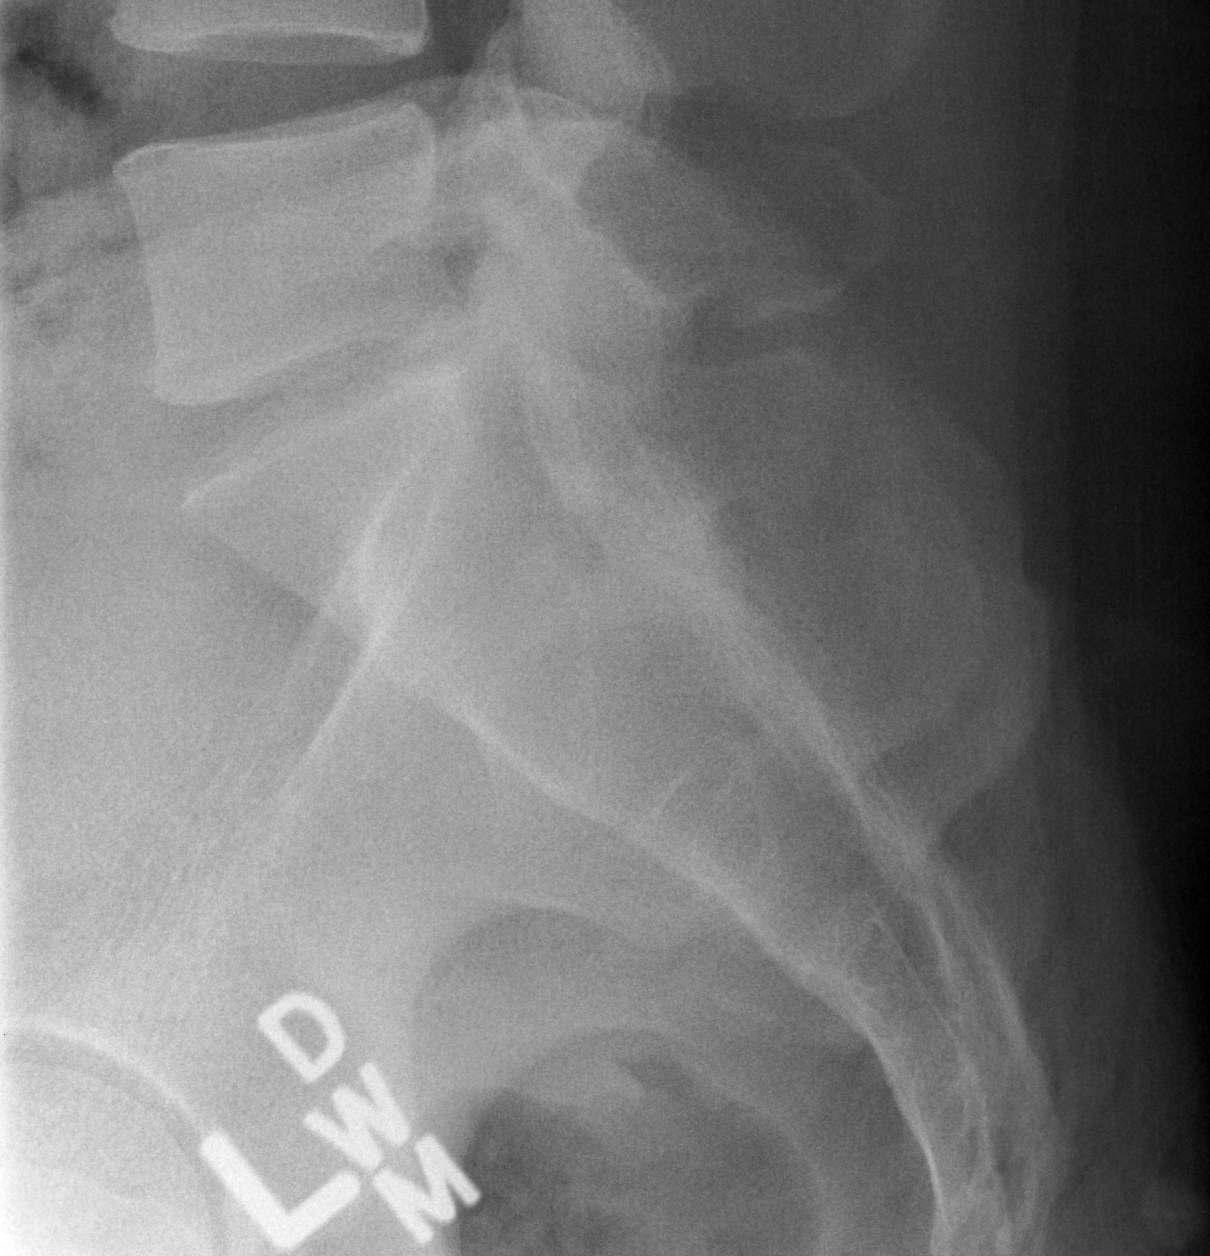

[5 of 5 positions shown; findings below may reference images not displayed]

FINDINGS: Normal alignment of lumbar vertebral bodies. No loss of vertebral
body height or disc height. No pars fracture. No subluxation.
IMPRESSION: Normal lumbar radiographs

## 2017-04-07 ENCOUNTER — Encounter (HOSPITAL_COMMUNITY): Payer: Self-pay | Admitting: Psychiatry

## 2017-04-07 ENCOUNTER — Ambulatory Visit (INDEPENDENT_AMBULATORY_CARE_PROVIDER_SITE_OTHER): Payer: Medicaid Other | Admitting: Psychiatry

## 2017-04-07 VITALS — BP 132/74 | HR 84 | Ht 72.0 in | Wt 256.0 lb

## 2017-04-07 DIAGNOSIS — F411 Generalized anxiety disorder: Secondary | ICD-10-CM

## 2017-04-07 DIAGNOSIS — G4730 Sleep apnea, unspecified: Secondary | ICD-10-CM | POA: Diagnosis not present

## 2017-04-07 DIAGNOSIS — Z79899 Other long term (current) drug therapy: Secondary | ICD-10-CM | POA: Diagnosis not present

## 2017-04-07 DIAGNOSIS — G44229 Chronic tension-type headache, not intractable: Secondary | ICD-10-CM | POA: Diagnosis not present

## 2017-04-07 DIAGNOSIS — F4310 Post-traumatic stress disorder, unspecified: Secondary | ICD-10-CM | POA: Diagnosis not present

## 2017-04-07 DIAGNOSIS — F410 Panic disorder [episodic paroxysmal anxiety] without agoraphobia: Secondary | ICD-10-CM | POA: Diagnosis not present

## 2017-04-07 DIAGNOSIS — F341 Dysthymic disorder: Secondary | ICD-10-CM | POA: Diagnosis not present

## 2017-04-07 DIAGNOSIS — R5383 Other fatigue: Secondary | ICD-10-CM

## 2017-04-07 DIAGNOSIS — F17220 Nicotine dependence, chewing tobacco, uncomplicated: Secondary | ICD-10-CM | POA: Diagnosis not present

## 2017-04-07 MED ORDER — CLOMIPRAMINE HCL 75 MG PO CAPS: 75.0000 mg | ORAL_CAPSULE | Freq: Every day | ORAL | 3 refills | Status: DC

## 2017-04-07 MED ORDER — ALPRAZOLAM 0.5 MG PO TABS: 0.5000 mg | ORAL_TABLET | Freq: Two times a day (BID) | ORAL | 2 refills | Status: DC
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
## 2017-04-07 NOTE — Patient Instructions (Addendum)
STOP Effexor - sometimes folks can have withdrawal which typically involves crawling sensations, or nerve tingling - BUT clomipramine should reduce the likelihood of these withdrawal symptoms  START Clomipramine 75 mg nightly In 2 weeks, increase to 75 mg TWICE a day (morning at night),  OR Take 2 at night  Continue xanax as is for now  Lets get you set up for a sleep study - call  [phone] to schedule

## 2017-06-02 ENCOUNTER — Ambulatory Visit (HOSPITAL_COMMUNITY): Payer: Medicaid Other | Admitting: Psychiatry

## 2017-06-28 ENCOUNTER — Encounter (HOSPITAL_COMMUNITY): Payer: Self-pay | Admitting: Psychiatry

## 2017-06-28 ENCOUNTER — Ambulatory Visit (INDEPENDENT_AMBULATORY_CARE_PROVIDER_SITE_OTHER): Payer: Medicaid Other | Admitting: Psychiatry

## 2017-06-28 VITALS — BP 113/77 | HR 83 | Ht 72.0 in | Wt 251.0 lb

## 2017-06-28 DIAGNOSIS — F4010 Social phobia, unspecified: Secondary | ICD-10-CM

## 2017-06-28 DIAGNOSIS — Z79899 Other long term (current) drug therapy: Secondary | ICD-10-CM | POA: Diagnosis not present

## 2017-06-28 DIAGNOSIS — F17220 Nicotine dependence, chewing tobacco, uncomplicated: Secondary | ICD-10-CM

## 2017-06-28 DIAGNOSIS — F4312 Post-traumatic stress disorder, chronic: Secondary | ICD-10-CM | POA: Diagnosis not present

## 2017-06-28 DIAGNOSIS — F341 Dysthymic disorder: Secondary | ICD-10-CM | POA: Diagnosis not present

## 2017-06-28 DIAGNOSIS — F410 Panic disorder [episodic paroxysmal anxiety] without agoraphobia: Secondary | ICD-10-CM | POA: Diagnosis not present

## 2017-06-28 DIAGNOSIS — F4000 Agoraphobia, unspecified: Secondary | ICD-10-CM

## 2017-06-28 MED ORDER — ALPRAZOLAM 0.5 MG PO TABS: 0.5000 mg | ORAL_TABLET | Freq: Two times a day (BID) | ORAL | 2 refills | Status: DC

## 2017-06-28 MED ORDER — CLOMIPRAMINE HCL 75 MG PO CAPS: 150.0000 mg | ORAL_CAPSULE | Freq: Every day | ORAL | 0 refills | Status: DC

## 2017-06-28 MED ORDER — PROPRANOLOL HCL 10 MG PO TABS: 10.0000 mg | ORAL_TABLET | Freq: Three times a day (TID) | ORAL | 2 refills | Status: DC

## 2017-06-28 NOTE — Progress Notes (Signed)
BH MD/PA/NP OP Progress Note  06/28/2017 8:33 AM Peter Foster  MRN:  [id number]  Chief Complaint: med management  HPI: Peter CahillMichael Foster presents after initial assessment.  No show for last visit. Reports some benefit from clomipramine. Ongoing social and public place anxiety. Agreed to augment with propranolol 10 mg tid. Continues 1-2 tablets of xanax 0.5 mg as needed daily. Sleeping is okay. 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 Considering SSDI application with [place]. Discussed referral for therapy, wants to hold off for now given that his medicaid expires next month.  Will rtc in 3 months. Provided patient with orange card application for [facility].  Visit Diagnosis:    ICD-10-CM   1. Persistent depressive disorder F34.1 propranolol (INDERAL) 10 MG tablet    clomiPRAMINE (ANAFRANIL) 75 MG capsule  2. Chronic post-traumatic stress disorder (PTSD) F43.12 propranolol (INDERAL) 10 MG tablet  3. Panic disorder F41.0 ALPRAZolam (XANAX) 0.5 MG tablet    Past Psychiatric History: See intake H&P for full details. Reviewed, with no updates at this time.  Past Medical History:  Past Medical History:  Diagnosis Date  . Anxiety   . Back pain   . Depression   . Headache   . PTSD (post-traumatic stress disorder)    History reviewed. No pertinent surgical history.  Family Psychiatric History: See intake H&P for full details. Reviewed, with no updates at this time.   Family History: History reviewed. No pertinent family history.  Social History:  Social History   Socioeconomic History  . Marital status: Married    Spouse name: Toniann FailWendy  . Number of children: 2  . Years of education: Not on file  . Highest education level: 12th grade  Occupational History  . Not on file  Social Needs  . Financial resource strain: Somewhat hard  . Food insecurity:    Worry: Never true    Inability: Never true  . Transportation needs:    Medical: No    Non-medical: No  Tobacco Use  .  Smoking status: Former Games developermoker  . Smokeless tobacco: Current User    Types: Chew  Substance and Sexual Activity  . Alcohol use: No  . Drug use: Yes    Types: Marijuana    Comment: every now and then  . Sexual activity: Yes    Birth control/protection: None  Lifestyle  . Physical activity:    Days per week: 5 days    Minutes per session: 50 min  . Stress: Very much  Relationships  . Social connections:    Talks on phone: Twice a week    Gets together: Never    Attends religious service: 1 to 4 times per year    Active member of club or organization: No    Attends meetings of clubs or organizations: Never    Relationship status: Married  Other Topics Concern  . Not on file  Social History Narrative  . Not on file    Allergies: No Known Allergies  Metabolic Disorder Labs: No results found for: HGBA1C, MPG No results found for: PROLACTIN No results found for: CHOL, TRIG, HDL, CHOLHDL, VLDL, LDLCALC No results found for: TSH  Therapeutic Level Labs: No results found for: LITHIUM No results found for: VALPROATE No components found for:  CBMZ  Current Medications: Current Outpatient Medications  Medication Sig Dispense Refill  . ALPRAZolam (XANAX) 0.5 MG tablet Take 1 tablet (0.5 mg total) by mouth 2 (two) times daily. 60 tablet 2  . clomiPRAMINE (ANAFRANIL) 75  MG capsule Take 2 capsules (150 mg total) by mouth at bedtime. Increase to 75 mg twice daily in 2 weeks 180 capsule 0  . dicyclomine (BENTYL) 20 MG tablet Take 1 tablet (20 mg total) by mouth 2 (two) times daily. 20 tablet 0  . fluticasone (FLONASE) 50 MCG/ACT nasal spray SHAKE LQ AND U 1 SPR IEN BID PRN  4  . oxyCODONE-acetaminophen (PERCOCET/ROXICET) 5-325 MG per tablet Take 1-2 tablets by mouth every 6 (six) hours as needed for severe pain (for pain). 30 tablet 0  . meloxicam (MOBIC) 7.5 MG tablet Take 1 tablet (7.5 mg total) by mouth daily. (Patient not taking: Reported on 06/28/2017) 7 tablet 0  . propranolol  (INDERAL) 10 MG tablet Take 1 tablet (10 mg total) by mouth 3 (three) times daily. 90 tablet 2   No current facility-administered medications for this visit.      Musculoskeletal: Strength & Muscle Tone: within normal limits Gait & Station: normal Patient leans: N/A  Psychiatric Specialty Exam: ROS  Blood pressure 113/77, pulse 83, height 6' (1.829 m), weight 251 lb (113.9 kg), SpO2 98 %.Body mass index is 34.04 kg/m.  General Appearance: Casual and Fairly Groomed  Eye Contact:  Fair  Speech:  Clear and Coherent and Normal Rate  Volume:  Normal  Mood:  Euthymic and better than before  Affect:  Congruent  Thought Process:  Goal Directed and Descriptions of Associations: Intact  Orientation:  Full (Time, Place, and Person)  Thought Content: Logical   Suicidal Thoughts:  No  Homicidal Thoughts:  No  Memory:  Immediate;   Fair  Judgement:  Fair  Insight:  Fair  Psychomotor Activity:  Normal  Concentration:  Attention Span: Fair  Recall:  [employer] of Knowledge: Fair  Language: Good  Akathisia:  Negative  Handed:  Right  AIMS (if indicated): not done  Assets:  Communication Skills Desire for Improvement Financial Resources/Insurance Housing  ADL's:  Intact  Cognition: WNL  Sleep:  Fair   Screenings:   Assessment and Plan: Josep Luviano reports interval improvement in mood, depression and sleep with clomipramine. Continues with some social anxiety and agoraphobia. No acute safety issues.  Working on trying to get out in public more. Agrees to trial of propranolol and rtc in 3 months.  Disclosed to patient that this Clinical research associate is leaving this practice at the end of August 2019, so to allow adequate time for a transition of care.   1. Persistent depressive disorder   2. Chronic post-traumatic stress disorder (PTSD)   3. Panic disorder     Status of current problems: mood improving, ongoing anxiety  Labs Ordered: No orders of the defined types were placed in this  encounter.   Labs Reviewed: n/a  Collateral Obtained/Records Reviewed: n/a  Plan:  Continue clomipramine 150 mg qhs Propranolol 10 mg tid for anxiety and benign essential tremor Xanax 0.25-0.5 mg BID prn rtc 3 months  I spent 15 minutes with the patient in direct face-to-face clinical care.  Greater than 50% of this time was spent in counseling and coordination of care with the patient.    Burnard Leigh, MD 06/28/2017, 8:33 AM

## 2017-08-23 ENCOUNTER — Ambulatory Visit (HOSPITAL_COMMUNITY): Payer: Self-pay | Admitting: Psychiatry

## 2017-11-14 ENCOUNTER — Other Ambulatory Visit (HOSPITAL_COMMUNITY): Payer: Self-pay

## 2017-11-14 DIAGNOSIS — F410 Panic disorder [episodic paroxysmal anxiety] without agoraphobia: Secondary | ICD-10-CM

## 2017-11-14 DIAGNOSIS — F341 Dysthymic disorder: Secondary | ICD-10-CM

## 2017-11-14 DIAGNOSIS — F4312 Post-traumatic stress disorder, chronic: Secondary | ICD-10-CM

## 2017-11-16 ENCOUNTER — Other Ambulatory Visit (HOSPITAL_COMMUNITY): Payer: Self-pay

## 2017-11-16 DIAGNOSIS — F4312 Post-traumatic stress disorder, chronic: Secondary | ICD-10-CM

## 2017-11-16 DIAGNOSIS — F341 Dysthymic disorder: Secondary | ICD-10-CM

## 2017-11-16 DIAGNOSIS — F410 Panic disorder [episodic paroxysmal anxiety] without agoraphobia: Secondary | ICD-10-CM

## 2017-11-16 MED ORDER — PROPRANOLOL HCL 10 MG PO TABS: 10.0000 mg | ORAL_TABLET | Freq: Three times a day (TID) | ORAL | 0 refills | Status: DC

## 2017-11-16 MED ORDER — ALPRAZOLAM 0.5 MG PO TABS: 0.5000 mg | ORAL_TABLET | Freq: Two times a day (BID) | ORAL | 0 refills | Status: DC

## 2017-11-16 MED ORDER — CLOMIPRAMINE HCL 75 MG PO CAPS: 150.0000 mg | ORAL_CAPSULE | Freq: Every day | ORAL | 0 refills | Status: DC

## 2018-01-03 ENCOUNTER — Telehealth (HOSPITAL_COMMUNITY): Payer: Self-pay

## 2018-01-03 NOTE — Telephone Encounter (Signed)
Called patient left phone number for return phone call back so I can help him.

## 2018-01-04 ENCOUNTER — Other Ambulatory Visit (HOSPITAL_COMMUNITY): Payer: Self-pay

## 2018-01-04 DIAGNOSIS — F410 Panic disorder [episodic paroxysmal anxiety] without agoraphobia: Secondary | ICD-10-CM

## 2018-01-04 NOTE — Telephone Encounter (Signed)
Called patient again @ [phone] left another voicemail message for a return phone call back.

## 2018-01-09 ENCOUNTER — Encounter (HOSPITAL_COMMUNITY): Payer: Self-pay | Admitting: Psychiatry

## 2018-01-09 ENCOUNTER — Ambulatory Visit (HOSPITAL_COMMUNITY): Payer: Self-pay | Admitting: Psychiatry

## 2018-01-09 VITALS — BP 121/79 | HR 83 | Ht 72.0 in | Wt 267.0 lb

## 2018-01-09 DIAGNOSIS — F331 Major depressive disorder, recurrent, moderate: Secondary | ICD-10-CM

## 2018-01-09 DIAGNOSIS — F410 Panic disorder [episodic paroxysmal anxiety] without agoraphobia: Secondary | ICD-10-CM

## 2018-01-09 DIAGNOSIS — F4312 Post-traumatic stress disorder, chronic: Secondary | ICD-10-CM

## 2018-01-09 MED ORDER — ALPRAZOLAM 0.5 MG PO TABS: 0.5000 mg | ORAL_TABLET | Freq: Two times a day (BID) | ORAL | 1 refills | Status: DC

## 2018-01-09 MED ORDER — SERTRALINE HCL 50 MG PO TABS: ORAL_TABLET | ORAL | 1 refills | Status: DC
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
## 2018-01-30 NOTE — Telephone Encounter (Signed)
Patient came in for an appointment to be seen on 01-09-18

## 2018-02-10 ENCOUNTER — Ambulatory Visit (HOSPITAL_COMMUNITY): Payer: Self-pay | Admitting: Psychiatry

## 2018-03-22 ENCOUNTER — Other Ambulatory Visit (HOSPITAL_COMMUNITY): Payer: Self-pay

## 2018-03-22 DIAGNOSIS — F410 Panic disorder [episodic paroxysmal anxiety] without agoraphobia: Secondary | ICD-10-CM

## 2018-03-22 DIAGNOSIS — F4312 Post-traumatic stress disorder, chronic: Secondary | ICD-10-CM

## 2018-03-22 MED ORDER — ALPRAZOLAM 0.5 MG PO TABS: 0.5000 mg | ORAL_TABLET | Freq: Two times a day (BID) | ORAL | 0 refills | Status: DC

## 2018-03-27 ENCOUNTER — Ambulatory Visit (HOSPITAL_COMMUNITY): Payer: Self-pay | Admitting: Psychiatry

## 2018-03-27 DIAGNOSIS — F331 Major depressive disorder, recurrent, moderate: Secondary | ICD-10-CM

## 2018-03-27 DIAGNOSIS — F4312 Post-traumatic stress disorder, chronic: Secondary | ICD-10-CM

## 2018-03-27 MED ORDER — SERTRALINE HCL 100 MG PO TABS: 100.0000 mg | ORAL_TABLET | Freq: Every day | ORAL | 1 refills | Status: DC

## 2018-03-27 MED ORDER — CLONAZEPAM 0.5 MG PO TABS: 0.5000 mg | ORAL_TABLET | Freq: Two times a day (BID) | ORAL | 1 refills | Status: DC
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
## 2018-05-29 ENCOUNTER — Telehealth (INDEPENDENT_AMBULATORY_CARE_PROVIDER_SITE_OTHER): Payer: Self-pay | Admitting: Psychiatry

## 2018-05-29 ENCOUNTER — Other Ambulatory Visit: Payer: Self-pay

## 2018-05-29 DIAGNOSIS — F4312 Post-traumatic stress disorder, chronic: Secondary | ICD-10-CM

## 2018-05-29 DIAGNOSIS — G8929 Other chronic pain: Secondary | ICD-10-CM

## 2018-05-29 DIAGNOSIS — F331 Major depressive disorder, recurrent, moderate: Secondary | ICD-10-CM

## 2018-05-29 DIAGNOSIS — F515 Nightmare disorder: Secondary | ICD-10-CM

## 2018-05-29 DIAGNOSIS — F411 Generalized anxiety disorder: Secondary | ICD-10-CM

## 2018-05-29 MED ORDER — CLONAZEPAM 0.5 MG PO TABS: 0.5000 mg | ORAL_TABLET | Freq: Two times a day (BID) | ORAL | 1 refills | Status: DC

## 2018-05-29 MED ORDER — SERTRALINE HCL 100 MG PO TABS: 100.0000 mg | ORAL_TABLET | Freq: Every day | ORAL | 1 refills | Status: DC
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
## 2018-06-09 ENCOUNTER — Other Ambulatory Visit (HOSPITAL_COMMUNITY): Payer: Self-pay

## 2018-06-09 DIAGNOSIS — F331 Major depressive disorder, recurrent, moderate: Secondary | ICD-10-CM

## 2018-06-09 DIAGNOSIS — F4312 Post-traumatic stress disorder, chronic: Secondary | ICD-10-CM

## 2018-08-07 ENCOUNTER — Telehealth (HOSPITAL_COMMUNITY): Payer: Self-pay

## 2018-08-07 NOTE — Telephone Encounter (Signed)
Patient called requesting a refill on his Zoloft 100mg . He stated that both pharmacies he uses has been out. He stated he would like it filled at [facility]. I reached out to pharmacy and they stated that they just got it in today so it is available for patient for his scheduled appointment on 08/08/18. Thank you.

## 2018-08-07 NOTE — Telephone Encounter (Signed)
Okay we will discuss tomorrow on his appointment about his pharmacy.

## 2018-08-08 ENCOUNTER — Encounter (HOSPITAL_COMMUNITY): Payer: Self-pay | Admitting: Psychiatry

## 2018-08-08 ENCOUNTER — Ambulatory Visit (INDEPENDENT_AMBULATORY_CARE_PROVIDER_SITE_OTHER): Payer: Self-pay | Admitting: Psychiatry

## 2018-08-08 ENCOUNTER — Other Ambulatory Visit: Payer: Self-pay

## 2018-08-08 DIAGNOSIS — F331 Major depressive disorder, recurrent, moderate: Secondary | ICD-10-CM

## 2018-08-08 DIAGNOSIS — F4312 Post-traumatic stress disorder, chronic: Secondary | ICD-10-CM

## 2018-08-08 MED ORDER — SERTRALINE HCL 100 MG PO TABS: 100.0000 mg | ORAL_TABLET | Freq: Every day | ORAL | 0 refills | Status: DC

## 2018-08-08 MED ORDER — CLONAZEPAM 0.5 MG PO TABS: 0.5000 mg | ORAL_TABLET | Freq: Two times a day (BID) | ORAL | 2 refills | Status: DC
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
## 2018-11-08 ENCOUNTER — Other Ambulatory Visit: Payer: Self-pay

## 2018-11-08 ENCOUNTER — Ambulatory Visit (HOSPITAL_COMMUNITY): Payer: Self-pay | Admitting: Psychiatry

## 2019-01-04 ENCOUNTER — Other Ambulatory Visit (HOSPITAL_COMMUNITY): Payer: Self-pay

## 2019-01-04 DIAGNOSIS — F331 Major depressive disorder, recurrent, moderate: Secondary | ICD-10-CM

## 2019-01-04 DIAGNOSIS — F4312 Post-traumatic stress disorder, chronic: Secondary | ICD-10-CM

## 2019-01-04 MED ORDER — CLONAZEPAM 0.5 MG PO TABS: 0.5000 mg | ORAL_TABLET | Freq: Two times a day (BID) | ORAL | 0 refills | Status: DC

## 2019-01-04 MED ORDER — SERTRALINE HCL 100 MG PO TABS: 100.0000 mg | ORAL_TABLET | Freq: Every day | ORAL | 0 refills | Status: DC

## 2019-01-23 ENCOUNTER — Ambulatory Visit (INDEPENDENT_AMBULATORY_CARE_PROVIDER_SITE_OTHER): Payer: Self-pay | Admitting: Psychiatry

## 2019-01-23 ENCOUNTER — Encounter (HOSPITAL_COMMUNITY): Payer: Self-pay | Admitting: Psychiatry

## 2019-01-23 ENCOUNTER — Other Ambulatory Visit: Payer: Self-pay

## 2019-01-23 DIAGNOSIS — F331 Major depressive disorder, recurrent, moderate: Secondary | ICD-10-CM

## 2019-01-23 DIAGNOSIS — F4312 Post-traumatic stress disorder, chronic: Secondary | ICD-10-CM

## 2019-01-23 MED ORDER — SERTRALINE HCL 100 MG PO TABS: 100.0000 mg | ORAL_TABLET | Freq: Every day | ORAL | 2 refills | Status: DC

## 2019-01-23 MED ORDER — CLONAZEPAM 0.5 MG PO TABS: 0.5000 mg | ORAL_TABLET | Freq: Two times a day (BID) | ORAL | 0 refills | Status: DC
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
## 2019-02-28 ENCOUNTER — Telehealth (HOSPITAL_COMMUNITY): Payer: Self-pay | Admitting: *Deleted

## 2019-02-28 DIAGNOSIS — F331 Major depressive disorder, recurrent, moderate: Secondary | ICD-10-CM

## 2019-02-28 DIAGNOSIS — F4312 Post-traumatic stress disorder, chronic: Secondary | ICD-10-CM

## 2019-02-28 NOTE — Telephone Encounter (Signed)
Patient called left voicemail stating he needs a refill of Klonopin as he only has 1 pill left. He would like 60 pills instead of 30 since he is taking them twice daily.

## 2019-03-07 MED ORDER — CLONAZEPAM 0.5 MG PO TABS: 0.5000 mg | ORAL_TABLET | Freq: Two times a day (BID) | ORAL | 0 refills | Status: DC

## 2019-03-07 NOTE — Telephone Encounter (Signed)
done

## 2019-04-23 ENCOUNTER — Other Ambulatory Visit (HOSPITAL_COMMUNITY): Payer: Self-pay | Admitting: *Deleted

## 2019-04-23 ENCOUNTER — Telehealth (HOSPITAL_COMMUNITY): Payer: Self-pay | Admitting: *Deleted

## 2019-04-23 DIAGNOSIS — F4312 Post-traumatic stress disorder, chronic: Secondary | ICD-10-CM

## 2019-04-23 DIAGNOSIS — F331 Major depressive disorder, recurrent, moderate: Secondary | ICD-10-CM

## 2019-04-23 MED ORDER — CLONAZEPAM 0.5 MG PO TABS: 0.5000 mg | ORAL_TABLET | Freq: Two times a day (BID) | ORAL | 0 refills | Status: DC

## 2019-04-23 NOTE — Telephone Encounter (Signed)
Nurse spoke with pt regarding refill request for Clonazepam 0.5mg  bid. Last written 03/07/19. Pt has an appointment tomorrow with you but feels he is having increased anxiety due to "stretching out" medication. Please review.

## 2019-04-23 NOTE — Telephone Encounter (Signed)
Will discuss with him tomorrow on his appointment about klonopin. Did he get blood work?

## 2019-04-24 ENCOUNTER — Encounter (HOSPITAL_COMMUNITY): Payer: Self-pay | Admitting: Psychiatry

## 2019-04-24 ENCOUNTER — Ambulatory Visit (INDEPENDENT_AMBULATORY_CARE_PROVIDER_SITE_OTHER): Payer: Self-pay | Admitting: Psychiatry

## 2019-04-24 ENCOUNTER — Other Ambulatory Visit: Payer: Self-pay

## 2019-04-24 DIAGNOSIS — F410 Panic disorder [episodic paroxysmal anxiety] without agoraphobia: Secondary | ICD-10-CM

## 2019-04-24 DIAGNOSIS — F4312 Post-traumatic stress disorder, chronic: Secondary | ICD-10-CM

## 2019-04-24 DIAGNOSIS — F331 Major depressive disorder, recurrent, moderate: Secondary | ICD-10-CM

## 2019-04-24 MED ORDER — SERTRALINE HCL 100 MG PO TABS: 150.0000 mg | ORAL_TABLET | Freq: Every day | ORAL | 2 refills | Status: DC

## 2019-04-24 MED ORDER — CLONAZEPAM 0.5 MG PO TABS: ORAL_TABLET | ORAL | 2 refills | Status: DC

## 2019-04-24 MED ORDER — CLONAZEPAM 0.5 MG PO TABS: ORAL_TABLET | ORAL | 0 refills | Status: DC
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
## 2019-07-23 ENCOUNTER — Other Ambulatory Visit: Payer: Self-pay

## 2019-07-23 ENCOUNTER — Telehealth (HOSPITAL_COMMUNITY): Payer: Self-pay | Admitting: Psychiatry

## 2019-10-31 ENCOUNTER — Encounter (HOSPITAL_COMMUNITY): Payer: Self-pay | Admitting: Psychiatry

## 2019-10-31 ENCOUNTER — Telehealth (HOSPITAL_COMMUNITY): Payer: Self-pay | Admitting: Psychiatry

## 2019-10-31 ENCOUNTER — Other Ambulatory Visit: Payer: Self-pay

## 2019-10-31 ENCOUNTER — Telehealth (INDEPENDENT_AMBULATORY_CARE_PROVIDER_SITE_OTHER): Payer: Self-pay | Admitting: Psychiatry

## 2019-10-31 DIAGNOSIS — F331 Major depressive disorder, recurrent, moderate: Secondary | ICD-10-CM

## 2019-10-31 DIAGNOSIS — F4312 Post-traumatic stress disorder, chronic: Secondary | ICD-10-CM

## 2019-10-31 DIAGNOSIS — F410 Panic disorder [episodic paroxysmal anxiety] without agoraphobia: Secondary | ICD-10-CM

## 2019-10-31 MED ORDER — CLONAZEPAM 0.5 MG PO TABS: ORAL_TABLET | ORAL | 1 refills | Status: DC

## 2019-10-31 MED ORDER — SERTRALINE HCL 100 MG PO TABS: 150.0000 mg | ORAL_TABLET | Freq: Every day | ORAL | 2 refills | Status: DC
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
## 2020-01-29 ENCOUNTER — Encounter (HOSPITAL_COMMUNITY): Payer: Self-pay | Admitting: Psychiatry

## 2020-01-29 ENCOUNTER — Other Ambulatory Visit: Payer: Self-pay

## 2020-01-29 ENCOUNTER — Telehealth (INDEPENDENT_AMBULATORY_CARE_PROVIDER_SITE_OTHER): Payer: Self-pay | Admitting: Psychiatry

## 2020-01-29 ENCOUNTER — Telehealth (HOSPITAL_COMMUNITY): Payer: Self-pay | Admitting: Psychiatry

## 2020-01-29 DIAGNOSIS — F4312 Post-traumatic stress disorder, chronic: Secondary | ICD-10-CM

## 2020-01-29 DIAGNOSIS — F331 Major depressive disorder, recurrent, moderate: Secondary | ICD-10-CM

## 2020-01-29 DIAGNOSIS — F410 Panic disorder [episodic paroxysmal anxiety] without agoraphobia: Secondary | ICD-10-CM

## 2020-01-29 MED ORDER — SERTRALINE HCL 100 MG PO TABS: 150.0000 mg | ORAL_TABLET | Freq: Every day | ORAL | 2 refills | Status: DC
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
## 2020-01-29 NOTE — Telephone Encounter (Signed)
01/29/20 10:35am Spoke with patient due to no  insurance on file - pt stated that his insurance does not go into effective until the first of the year 2022 and he will call with the information.  He is aware that he will be self-pay.Marland KitchenMarguerite Olea

## 2020-04-17 ENCOUNTER — Telehealth (HOSPITAL_COMMUNITY): Payer: Self-pay | Admitting: *Deleted

## 2020-04-17 DIAGNOSIS — F410 Panic disorder [episodic paroxysmal anxiety] without agoraphobia: Secondary | ICD-10-CM

## 2020-04-17 MED ORDER — CLONAZEPAM 0.5 MG PO TABS
ORAL_TABLET | ORAL | 0 refills | Status: DC
Start: 2020-04-17 — End: 2020-07-14

## 2020-04-17 NOTE — Telephone Encounter (Signed)
Pt called requesting refill on the Klonopin 0.5 mg last written 10/30/20 with one refill. Pt has an upcoming appointment on 2/04/08/20. Please review and advise. Thanks!

## 2020-04-17 NOTE — Telephone Encounter (Signed)
Send to [facility] to [place], [address].

## 2020-04-28 ENCOUNTER — Telehealth (INDEPENDENT_AMBULATORY_CARE_PROVIDER_SITE_OTHER): Payer: Self-pay | Admitting: Psychiatry

## 2020-04-28 ENCOUNTER — Other Ambulatory Visit: Payer: Self-pay

## 2020-04-28 ENCOUNTER — Encounter (HOSPITAL_COMMUNITY): Payer: Self-pay | Admitting: Psychiatry

## 2020-04-28 VITALS — Wt 242.0 lb

## 2020-04-28 DIAGNOSIS — F4312 Post-traumatic stress disorder, chronic: Secondary | ICD-10-CM

## 2020-04-28 DIAGNOSIS — F331 Major depressive disorder, recurrent, moderate: Secondary | ICD-10-CM

## 2020-04-28 DIAGNOSIS — F410 Panic disorder [episodic paroxysmal anxiety] without agoraphobia: Secondary | ICD-10-CM

## 2020-04-28 MED ORDER — SERTRALINE HCL 100 MG PO TABS: 150.0000 mg | ORAL_TABLET | Freq: Every day | ORAL | 2 refills | Status: DC
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
## 2020-07-08 ENCOUNTER — Other Ambulatory Visit (HOSPITAL_COMMUNITY): Payer: Self-pay | Admitting: Psychiatry

## 2020-07-08 DIAGNOSIS — F410 Panic disorder [episodic paroxysmal anxiety] without agoraphobia: Secondary | ICD-10-CM

## 2020-07-14 ENCOUNTER — Telehealth (HOSPITAL_COMMUNITY): Payer: Self-pay | Admitting: *Deleted

## 2020-07-14 DIAGNOSIS — F410 Panic disorder [episodic paroxysmal anxiety] without agoraphobia: Secondary | ICD-10-CM

## 2020-07-14 MED ORDER — CLONAZEPAM 0.5 MG PO TABS: ORAL_TABLET | ORAL | 0 refills | Status: DC

## 2020-07-14 NOTE — Telephone Encounter (Signed)
Patient needs refill of Klonopin to last to 07/24/20 appt.

## 2020-07-14 NOTE — Telephone Encounter (Signed)
Done

## 2020-07-24 ENCOUNTER — Other Ambulatory Visit: Payer: Self-pay

## 2020-07-24 ENCOUNTER — Telehealth (INDEPENDENT_AMBULATORY_CARE_PROVIDER_SITE_OTHER): Payer: Self-pay | Admitting: Psychiatry

## 2020-07-24 ENCOUNTER — Telehealth (HOSPITAL_COMMUNITY): Payer: Self-pay | Admitting: Psychiatry

## 2020-07-24 DIAGNOSIS — F410 Panic disorder [episodic paroxysmal anxiety] without agoraphobia: Secondary | ICD-10-CM

## 2020-07-24 DIAGNOSIS — F4312 Post-traumatic stress disorder, chronic: Secondary | ICD-10-CM

## 2020-07-24 DIAGNOSIS — F331 Major depressive disorder, recurrent, moderate: Secondary | ICD-10-CM

## 2020-07-25 MED ORDER — SERTRALINE HCL 100 MG PO TABS: 150.0000 mg | ORAL_TABLET | Freq: Every day | ORAL | 2 refills | Status: DC

## 2020-07-25 MED ORDER — CLONAZEPAM 0.5 MG PO TABS
ORAL_TABLET | ORAL | 0 refills | Status: DC
Start: 2020-07-25 — End: 2020-10-28
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
## 2020-10-28 ENCOUNTER — Encounter (HOSPITAL_COMMUNITY): Payer: Self-pay | Admitting: Psychiatry

## 2020-10-28 ENCOUNTER — Telehealth (INDEPENDENT_AMBULATORY_CARE_PROVIDER_SITE_OTHER): Payer: Self-pay | Admitting: Psychiatry

## 2020-10-28 ENCOUNTER — Other Ambulatory Visit: Payer: Self-pay

## 2020-10-28 DIAGNOSIS — F331 Major depressive disorder, recurrent, moderate: Secondary | ICD-10-CM

## 2020-10-28 DIAGNOSIS — F410 Panic disorder [episodic paroxysmal anxiety] without agoraphobia: Secondary | ICD-10-CM

## 2020-10-28 DIAGNOSIS — F4312 Post-traumatic stress disorder, chronic: Secondary | ICD-10-CM

## 2020-10-28 MED ORDER — CLONAZEPAM 0.5 MG PO TABS
ORAL_TABLET | ORAL | 0 refills | Status: DC
Start: 2020-10-28 — End: 2021-01-09

## 2020-10-28 MED ORDER — SERTRALINE HCL 100 MG PO TABS: 150.0000 mg | ORAL_TABLET | Freq: Every day | ORAL | 5 refills | Status: DC
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
## 2020-11-11 ENCOUNTER — Other Ambulatory Visit: Payer: Self-pay

## 2020-11-11 ENCOUNTER — Ambulatory Visit (HOSPITAL_COMMUNITY): Payer: Self-pay | Admitting: Licensed Clinical Social Worker

## 2021-01-07 ENCOUNTER — Other Ambulatory Visit (HOSPITAL_COMMUNITY): Payer: Self-pay | Admitting: Psychiatry

## 2021-01-07 DIAGNOSIS — F410 Panic disorder [episodic paroxysmal anxiety] without agoraphobia: Secondary | ICD-10-CM

## 2021-03-16 ENCOUNTER — Telehealth (HOSPITAL_COMMUNITY): Payer: Self-pay

## 2021-03-16 DIAGNOSIS — F410 Panic disorder [episodic paroxysmal anxiety] without agoraphobia: Secondary | ICD-10-CM

## 2021-03-16 MED ORDER — CLONAZEPAM 0.5 MG PO TABS: ORAL_TABLET | ORAL | 0 refills | Status: DC

## 2021-03-16 NOTE — Telephone Encounter (Signed)
Patient called requesting a refill on his Clonazepam 0.5mg  to be sent to [facility] on [address] in [place]. Please review and advise. Thank you

## 2021-03-16 NOTE — Telephone Encounter (Signed)
Done

## 2021-03-18 NOTE — Telephone Encounter (Signed)
Notified patient.

## 2021-04-02 ENCOUNTER — Ambulatory Visit: Payer: Self-pay | Admitting: Medical-Surgical

## 2021-04-13 ENCOUNTER — Other Ambulatory Visit: Payer: Self-pay

## 2021-04-13 ENCOUNTER — Emergency Department (HOSPITAL_BASED_OUTPATIENT_CLINIC_OR_DEPARTMENT_OTHER): Payer: No Typology Code available for payment source

## 2021-04-13 ENCOUNTER — Encounter (HOSPITAL_BASED_OUTPATIENT_CLINIC_OR_DEPARTMENT_OTHER): Payer: Self-pay

## 2021-04-13 ENCOUNTER — Emergency Department (HOSPITAL_BASED_OUTPATIENT_CLINIC_OR_DEPARTMENT_OTHER)
Admission: EM | Admit: 2021-04-13 | Discharge: 2021-04-14 | Disposition: A | Discharge status: Home / Self Care | Payer: No Typology Code available for payment source | Attending: Emergency Medicine | Admitting: Emergency Medicine

## 2021-04-13 DIAGNOSIS — R197 Diarrhea, unspecified: Secondary | ICD-10-CM | POA: Insufficient documentation

## 2021-04-13 DIAGNOSIS — R1110 Vomiting, unspecified: Secondary | ICD-10-CM | POA: Insufficient documentation

## 2021-04-13 DIAGNOSIS — R079 Chest pain, unspecified: Secondary | ICD-10-CM | POA: Diagnosis present

## 2021-04-13 NOTE — ED Triage Notes (Addendum)
Pt reports sudden onset of left sided chest pain that radiates down left arm, onset 2 hours PTA, associated with nausea and vomiting and SOB

## 2021-04-14 ENCOUNTER — Emergency Department (HOSPITAL_BASED_OUTPATIENT_CLINIC_OR_DEPARTMENT_OTHER): Payer: No Typology Code available for payment source

## 2021-04-14 LAB — CBC
HCT: 43.6 % (ref 39.0–52.0)
Hemoglobin: 15.8 g/dL (ref 13.0–17.0)
MCH: 30.2 pg (ref 26.0–34.0)
MCHC: 36.2 g/dL — ABNORMAL HIGH (ref 30.0–36.0)
MCV: 83.2 fL (ref 80.0–100.0)
Platelets: 380 10*3/uL (ref 150–400)
RBC: 5.24 MIL/uL (ref 4.22–5.81)
RDW: 12.9 % (ref 11.5–15.5)
WBC: 15.3 10*3/uL — ABNORMAL HIGH (ref 4.0–10.5)
nRBC: 0 % (ref 0.0–0.2)

## 2021-04-14 LAB — BASIC METABOLIC PANEL
Anion gap: 14 (ref 5–15)
BUN: 21 mg/dL — ABNORMAL HIGH (ref 6–20)
CO2: 22 mmol/L (ref 22–32)
Calcium: 9.3 mg/dL (ref 8.9–10.3)
Chloride: 101 mmol/L (ref 98–111)
Creatinine, Ser: 1.06 mg/dL (ref 0.61–1.24)
GFR, Estimated: >60 mL/min (ref >60)
Glucose, Bld: 124 mg/dL — ABNORMAL HIGH (ref 70–99)
Potassium: 3.4 mmol/L — ABNORMAL LOW (ref 3.5–5.1)
Sodium: 137 mmol/L (ref 135–145)

## 2021-04-14 LAB — TROPONIN I (HIGH SENSITIVITY)
Troponin I (High Sensitivity): 3 ng/L (ref <18)
Troponin I (High Sensitivity): 3 ng/L (ref <18)

## 2021-04-14 MED ORDER — IOHEXOL 300 MG/ML  SOLN
100.0000 mL | Freq: Once | INTRAMUSCULAR | Status: AC | PRN
  Administered 2021-04-14: 100 mL via INTRAVENOUS

## 2021-04-14 MED ORDER — LACTATED RINGERS IV BOLUS
1000.0000 mL | Freq: Once | INTRAVENOUS | Status: AC
Start: 2021-04-14 — End: 2021-04-14
  Administered 2021-04-14: 1000 mL via INTRAVENOUS

## 2021-04-14 MED ORDER — SODIUM CHLORIDE 0.9 % IV SOLN: INTRAVENOUS | Status: DC | PRN

## 2021-04-14 MED ORDER — PROMETHAZINE HCL 25 MG/ML IJ SOLN
INTRAMUSCULAR | Status: AC
  Filled 2021-04-14: qty 1

## 2021-04-14 MED ORDER — SODIUM CHLORIDE 0.9 % IV SOLN
12.5000 mg | Freq: Four times a day (QID) | INTRAVENOUS | Status: DC | PRN
  Administered 2021-04-14: 12.5 mg via INTRAVENOUS
  Filled 2021-04-14: qty 0.5

## 2021-04-14 MED ORDER — DROPERIDOL 2.5 MG/ML IJ SOLN
1.2500 mg | Freq: Once | INTRAMUSCULAR | Status: AC
  Administered 2021-04-14: 1.25 mg via INTRAVENOUS
  Filled 2021-04-14: qty 2

## 2021-04-14 MED ORDER — PROMETHAZINE HCL 25 MG PO TABS
25.0000 mg | ORAL_TABLET | Freq: Four times a day (QID) | ORAL | 0 refills | Status: AC | PRN
Start: 2021-04-14 — End: ?

## 2021-04-14 MED ORDER — PROMETHAZINE HCL 25 MG RE SUPP: 25.0000 mg | Freq: Four times a day (QID) | RECTAL | 0 refills | Status: DC | PRN
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
## 2021-04-14 NOTE — ED Notes (Signed)
Tried to hook pt up to the monitor but pt states unless he can get something for the N/V/D he can't get any test run. He has to go back and forth to the bathroom.

## 2021-04-14 NOTE — ED Notes (Signed)
Report given to AD RN °

## 2021-04-14 NOTE — ED Notes (Signed)
Pt. Is up and down to the rest room due to he reports vomiting and diarrhea.  Pt. Reports he cant sit still till he gets meds for the diarrhea and vomiting.  Pt. Standing over sink in room 7.

## 2021-04-14 NOTE — ED Provider Notes (Signed)
[facility] EMERGENCY DEPARTMENT Provider Note   CSN: [id number] Arrival date & time: 04/13/21  2343     History  Chief Complaint  Patient presents with   Chest Pain    Peter Foster is a 51 y.o. male.  51 year old male the presents to the emergency department today secondary to diarrhea and emesis.  Sounds like patient had Popeyes earlier today.  Has been having multiple episodes of nonbloody nonbilious emesis since that time.  Start having diarrhea once he got here.  Has pain associated with that as well.  No history of the same.  Feels sweaty and lightheaded related to his symptoms as well.  No fevers.  No known sick contacts.   Chest Pain     Home Medications Prior to Admission medications   Medication Sig Start Date End Date Taking? Authorizing Provider  promethazine (PHENERGAN) 25 MG suppository Place 1 suppository (25 mg total) rectally every 6 (six) hours as needed for nausea or vomiting. 04/14/21  Yes Jarek Longton, Barbara Cower, MD  promethazine (PHENERGAN) 25 MG tablet Take 1 tablet (25 mg total) by mouth every 6 (six) hours as needed for nausea or vomiting. 04/14/21  Yes Herberta Pickron, Barbara Cower, MD  clonazePAM (KLONOPIN) 0.5 MG tablet TAKE ONE TABLET BY MOUTH ONE TIME DAILY AS NEEDED FOR ANXIETY 03/16/21   Arfeen, Phillips Grout, MD  dicyclomine (BENTYL) 20 MG tablet Take 1 tablet (20 mg total) by mouth 2 (two) times daily. 05/21/14   Palumbo, April, MD  fluticasone (FLONASE) 50 MCG/ACT nasal spray SHAKE LQ AND U 1 SPR IEN BID PRN 02/25/17   [provider]  sertraline (ZOLOFT) 100 MG tablet Take 1.5 tablets (150 mg total) by mouth daily. 10/28/20   Arfeen, Phillips Grout, MD      Allergies    Patient has no known allergies.    Review of Systems   Review of Systems  Cardiovascular:  Positive for chest pain.   Physical Exam Updated Vital Signs BP 132/82 (BP Location: Right Arm)    Pulse 68    Temp 99 F (37.2 C) (Oral)    Resp 16    Ht 6' (1.829 m)    Wt 113.4 kg    SpO2 100%    BMI 33.91  kg/m  Physical Exam Vitals and nursing note reviewed.  Constitutional:      Appearance: He is well-developed.  HENT:     Head: Normocephalic and atraumatic.  Cardiovascular:     Rate and Rhythm: Normal rate.  Pulmonary:     Effort: Pulmonary effort is normal. No respiratory distress.     Breath sounds: No decreased breath sounds or wheezing.  Abdominal:     General: There is no distension.     Palpations: Abdomen is soft.  Musculoskeletal:        General: Normal range of motion.     Cervical back: Normal range of motion.  Skin:    General: Skin is warm and dry.  Neurological:     General: No focal deficit present.     Mental Status: He is alert.    ED Results / Procedures / Treatments   Labs (all labs ordered are listed, but only abnormal results are displayed) Labs Reviewed  BASIC METABOLIC PANEL - Abnormal; Notable for the following components:      Result Value   Potassium 3.4 (*)    Glucose, Bld 124 (*)    BUN 21 (*)    All other components within normal limits  CBC -  Abnormal; Notable for the following components:   WBC 15.3 (*)    MCHC 36.2 (*)    All other components within normal limits  TROPONIN I (HIGH SENSITIVITY)  TROPONIN I (HIGH SENSITIVITY)    EKG None  Radiology DG Chest 2 View  Result Date: 04/14/2021 CLINICAL DATA:  Chest pain EXAM: CHEST - 2 VIEW COMPARISON:  None. FINDINGS: Heart and mediastinal contours are within normal limits. No focal opacities or effusions. No acute bony abnormality. IMPRESSION: No active cardiopulmonary disease. Electronically Signed   By: Charlett Nose M.D.   On: 04/14/2021 00:32   CT ABDOMEN PELVIS W CONTRAST  Result Date: 04/14/2021 CLINICAL DATA:  Acute, nonlocalized abdominal pain. Nausea, vomiting, and diarrhea EXAM: CT ABDOMEN AND PELVIS WITH CONTRAST TECHNIQUE: Multidetector CT imaging of the abdomen and pelvis was performed using the standard protocol following bolus administration of intravenous contrast.  RADIATION DOSE REDUCTION: This exam was performed according to the departmental dose-optimization program which includes automated exposure control, adjustment of the mA and/or kV according to patient size and/or use of iterative reconstruction technique. CONTRAST:  OMNIPAQUE IOHEXOL 300 MG/ML  SOLN COMPARISON:  05/07/2014 FINDINGS: Lower chest:  Small sliding hiatal hernia Hepatobiliary: Tiny low-density in the posterior right liver, too small for densitometry but possibly stable on prior, see 2:6.no evidence of biliary obstruction or stone. Pancreas: Unremarkable. Spleen: Unremarkable. Adrenals/Urinary Tract: Negative adrenals. No hydronephrosis or stone. Tiny low-density at the lower pole right kidney, too small for densitometry. Unremarkable bladder. Stomach/Bowel:  No obstruction. No appendicitis. Vascular/Lymphatic: No acute vascular abnormality. No mass or adenopathy. Reproductive:No pathologic findings. Other: No ascites or pneumoperitoneum. Musculoskeletal: No acute abnormalities. L5-S1 degenerative disc narrowing and ridging. IMPRESSION: No acute finding. Negative for bowel obstruction or visible inflammation. Electronically Signed   By: Tiburcio Pea M.D.   On: 04/14/2021 03:55    Procedures Procedures    Medications Ordered in ED Medications  promethazine (PHENERGAN) 12.5 mg in sodium chloride 0.9 % 50 mL IVPB (0 mg Intravenous Stopped 04/14/21 0127)  promethazine (PHENERGAN) 25 MG/ML injection (  Not Given 04/14/21 0117)  0.9 %  sodium chloride infusion ( Intravenous Stopped 04/14/21 0130)  lactated ringers bolus 1,000 mL ( Intravenous Stopped 04/14/21 0211)  droperidol (INAPSINE) 2.5 MG/ML injection 1.25 mg (1.25 mg Intravenous Given 04/14/21 0153)  iohexol (OMNIPAQUE) 300 MG/ML solution 100 mL (100 mLs Intravenous Contrast Given 04/14/21 0328)    ED Course/ Medical Decision Making/ A&P                           Medical Decision Making Amount and/or Complexity of Data Reviewed Labs:  ordered. Radiology: ordered.  Risk Prescription drug management.   Suspect likely viral gastroenteritis however even after his emesis had improved significantly patient still with pretty severe epigastric pain.  Could be gastritis or peptic ulcer disease however diarrhea would be really consistent with that.  Could just be regular enteritis however with his age and how quickly his leukocytosis we will go ahead and do a CT scan.  If this is fine his symptoms are still improved will likely discharge.  CT unremarkable.  Observed for least a couple hours and asymptomatic.  Patient appears to be stable for discharge at this time.  Symptomatic treatment given.  Anticipatory guidance provided.   Final Clinical Impression(s) / ED Diagnoses Final diagnoses:  Nonspecific chest pain  Vomiting, unspecified vomiting type, unspecified whether nausea present    Rx / DC Orders  ED Discharge Orders          Ordered    promethazine (PHENERGAN) 25 MG tablet  Every 6 hours PRN        04/14/21 0510    promethazine (PHENERGAN) 25 MG suppository  Every 6 hours PRN        04/14/21 0510              Taylin Leder, Barbara Cower, MD 04/14/21 [phone]

## 2021-04-27 ENCOUNTER — Telehealth (HOSPITAL_COMMUNITY): Payer: Self-pay | Admitting: Psychiatry

## 2021-04-28 ENCOUNTER — Other Ambulatory Visit: Payer: Self-pay

## 2021-04-28 ENCOUNTER — Telehealth (HOSPITAL_BASED_OUTPATIENT_CLINIC_OR_DEPARTMENT_OTHER): Payer: No Typology Code available for payment source | Admitting: Psychiatry

## 2021-04-28 ENCOUNTER — Encounter (HOSPITAL_COMMUNITY): Payer: Self-pay | Admitting: Psychiatry

## 2021-04-28 DIAGNOSIS — F410 Panic disorder [episodic paroxysmal anxiety] without agoraphobia: Secondary | ICD-10-CM | POA: Diagnosis not present

## 2021-04-28 DIAGNOSIS — F331 Major depressive disorder, recurrent, moderate: Secondary | ICD-10-CM | POA: Diagnosis not present

## 2021-04-28 DIAGNOSIS — F4312 Post-traumatic stress disorder, chronic: Secondary | ICD-10-CM | POA: Diagnosis not present

## 2021-04-28 MED ORDER — CLONAZEPAM 0.5 MG PO TABS: ORAL_TABLET | ORAL | 0 refills | Status: DC

## 2021-04-28 MED ORDER — SERTRALINE HCL 100 MG PO TABS: 150.0000 mg | ORAL_TABLET | Freq: Every day | ORAL | 5 refills | Status: DC
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
## 2021-05-22 ENCOUNTER — Ambulatory Visit (INDEPENDENT_AMBULATORY_CARE_PROVIDER_SITE_OTHER): Payer: Self-pay | Admitting: Medical-Surgical

## 2021-05-22 DIAGNOSIS — Z91199 Patient's noncompliance with other medical treatment and regimen due to unspecified reason: Secondary | ICD-10-CM

## 2021-05-22 NOTE — Progress Notes (Signed)
   Complete physical exam  Patient: Peter Foster   DOB: 12/26/1998   51 y.o. Male  MRN: [id number]  Subjective:    No chief complaint on file.   Peter Foster is a 51 y.o. male who presents today for a complete physical exam. She reports consuming a {diet types:17450} diet. {types:19826} She generally feels {DESC; WELL/FAIRLY WELL/POORLY:18703}. She reports sleeping {DESC; WELL/FAIRLY WELL/POORLY:18703}. She {does/does not:200015} have additional problems to discuss today.    Most recent fall risk assessment:    09/02/2021   10:42 AM  Fall Risk   Falls in the past year? 0  Number falls in past yr: 0  Injury with Fall? 0  Risk for fall due to : No Fall Risks  Follow up Falls evaluation completed     Most recent depression screenings:    09/02/2021   10:42 AM 07/24/2020   10:46 AM  PHQ 2/9 Scores  PHQ - 2 Score 0 0  PHQ- 9 Score 5     {VISON DENTAL STD PSA (Optional):27386}  {History (Optional):23778}  Patient Care Team: Tre Sanker, NP as PCP - General (Nurse Practitioner)   Outpatient Medications Prior to Visit  Medication Sig   fluticasone (FLONASE) 50 MCG/ACT nasal spray Place 2 sprays into both nostrils in the morning and at bedtime. After 7 days, reduce to once daily.   norgestimate-ethinyl estradiol (SPRINTEC 28) 0.25-35 MG-MCG tablet Take 1 tablet by mouth daily.   Nystatin POWD Apply liberally to affected area 2 times per day   spironolactone (ALDACTONE) 100 MG tablet Take 1 tablet (100 mg total) by mouth daily.   No facility-administered medications prior to visit.    ROS        Objective:     There were no vitals taken for this visit. {Vitals History (Optional):23777}  Physical Exam   No results found for any visits on 10/08/21. {Show previous labs (optional):23779}    Assessment & Plan:    Routine Health Maintenance and Physical Exam  Immunization History  Administered Date(s) Administered   DTaP 03/11/1999, 05/07/1999,  07/16/1999, 03/31/2000, 10/15/2003   Hepatitis A 08/11/2007, 08/16/2008   Hepatitis B 12/27/1998, 02/03/1999, 07/16/1999   HiB (PRP-OMP) 03/11/1999, 05/07/1999, 07/16/1999, 03/31/2000   IPV 03/11/1999, 05/07/1999, 01/04/2000, 10/15/2003   Influenza,inj,Quad PF,6+ Mos 11/16/2013   Influenza-Unspecified 02/16/2012   MMR 01/03/2001, 10/15/2003   Meningococcal Polysaccharide 08/16/2011   Pneumococcal Conjugate-13 03/31/2000   Pneumococcal-Unspecified 07/16/1999, 09/29/1999   Tdap 08/16/2011   Varicella 01/04/2000, 08/11/2007    Health Maintenance  Topic Date Due   HIV Screening  Never done   Hepatitis C Screening  Never done   INFLUENZA VACCINE  10/06/2021   PAP-Cervical Cytology Screening  10/08/2021 (Originally 12/26/2019)   PAP SMEAR-Modifier  10/08/2021 (Originally 12/26/2019)   TETANUS/TDAP  10/08/2021 (Originally 08/15/2021)   HPV VACCINES  Discontinued   COVID-19 Vaccine  Discontinued    Discussed health benefits of physical activity, and encouraged her to engage in regular exercise appropriate for her age and condition.  Problem List Items Addressed This Visit   None Visit Diagnoses     Annual physical exam    -  Primary   Cervical cancer screening       Need for Tdap vaccination          No follow-ups on file.     Timo Hartwig, NP   

## 2021-06-05 ENCOUNTER — Encounter: Payer: Self-pay | Admitting: Medical-Surgical

## 2021-07-15 ENCOUNTER — Telehealth (HOSPITAL_COMMUNITY): Payer: Self-pay | Admitting: *Deleted

## 2021-07-15 DIAGNOSIS — F410 Panic disorder [episodic paroxysmal anxiety] without agoraphobia: Secondary | ICD-10-CM

## 2021-07-15 MED ORDER — CLONAZEPAM 0.5 MG PO TABS: ORAL_TABLET | ORAL | 0 refills | Status: DC

## 2021-07-15 NOTE — Telephone Encounter (Signed)
Done

## 2021-07-15 NOTE — Telephone Encounter (Signed)
Received fax from [facility] requesting for pt a refill on the Klonopin 0.5 mg. Last fill date was on 04/29/21 #30. That was his last visit. Pt next appointment scheduled for 10/26/21. Please review. ?

## 2021-09-15 ENCOUNTER — Telehealth (HOSPITAL_COMMUNITY): Payer: Self-pay | Admitting: *Deleted

## 2021-09-15 DIAGNOSIS — F410 Panic disorder [episodic paroxysmal anxiety] without agoraphobia: Secondary | ICD-10-CM

## 2021-09-15 MED ORDER — CLONAZEPAM 0.5 MG PO TABS: ORAL_TABLET | ORAL | 0 refills | Status: DC

## 2021-09-15 NOTE — Telephone Encounter (Signed)
Done

## 2021-09-15 NOTE — Telephone Encounter (Signed)
Received fax from [facility] requesting refill of Klonopin 0.5 mg tabs #30. Last fill date was on 07/16/21. Pt has an upcoming appointment on 10/26/21. Please review and advise.

## 2021-10-26 ENCOUNTER — Encounter (HOSPITAL_COMMUNITY): Payer: Self-pay | Admitting: Psychiatry

## 2021-10-26 ENCOUNTER — Telehealth (HOSPITAL_BASED_OUTPATIENT_CLINIC_OR_DEPARTMENT_OTHER): Payer: No Typology Code available for payment source | Admitting: Psychiatry

## 2021-10-26 DIAGNOSIS — F331 Major depressive disorder, recurrent, moderate: Secondary | ICD-10-CM

## 2021-10-26 DIAGNOSIS — F4312 Post-traumatic stress disorder, chronic: Secondary | ICD-10-CM

## 2021-10-26 DIAGNOSIS — F410 Panic disorder [episodic paroxysmal anxiety] without agoraphobia: Secondary | ICD-10-CM | POA: Diagnosis not present

## 2021-10-26 MED ORDER — SERTRALINE HCL 100 MG PO TABS: 150.0000 mg | ORAL_TABLET | Freq: Every day | ORAL | 5 refills | Status: DC

## 2021-10-26 MED ORDER — CLONAZEPAM 0.5 MG PO TABS: ORAL_TABLET | ORAL | 0 refills | Status: DC
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
## 2022-01-07 ENCOUNTER — Other Ambulatory Visit (HOSPITAL_COMMUNITY): Payer: Self-pay | Admitting: Psychiatry

## 2022-01-07 DIAGNOSIS — F410 Panic disorder [episodic paroxysmal anxiety] without agoraphobia: Secondary | ICD-10-CM

## 2022-03-03 ENCOUNTER — Other Ambulatory Visit (HOSPITAL_COMMUNITY): Payer: Self-pay | Admitting: Psychiatry

## 2022-03-03 DIAGNOSIS — F410 Panic disorder [episodic paroxysmal anxiety] without agoraphobia: Secondary | ICD-10-CM

## 2022-03-03 NOTE — Telephone Encounter (Signed)
Covering for Dr. Lolly Mustache over holiday. PDMP reviewed with appropriate fill history. Will provide 2 week supply for now with further refills to be supplied by primary psychiatrist on their return.

## 2022-04-28 ENCOUNTER — Encounter (HOSPITAL_COMMUNITY): Payer: No Typology Code available for payment source | Admitting: Psychiatry

## 2022-04-28 ENCOUNTER — Encounter (HOSPITAL_COMMUNITY): Payer: Self-pay

## 2022-04-28 NOTE — Progress Notes (Deleted)
[facility] MD Virtual Progress Note   Patient Location: *** Provider Location: ***  I connect with patient by *** and verified that I am speaking with correct person by using two identifiers. I discussed the limitations of evaluation and management by telemedicine and the availability of in person appointments. I also discussed with the patient that there may be a patient responsible charge related to this service. The patient expressed understanding and agreed to proceed.  Magic Kuhar [id number] 51 y.o.  04/28/2022 2:05 PM    History of Present Illness:  ***   Past Psychiatric History:    Outpatient Encounter Medications as of 04/28/2022  Medication Sig  . clonazePAM (KLONOPIN) 0.5 MG tablet TAKE ONE TABLET BY MOUTH ONE TIME DAILY AS NEEDED FOR ANXIETY  . dicyclomine (BENTYL) 20 MG tablet Take 1 tablet (20 mg total) by mouth 2 (two) times daily.  . fluticasone (FLONASE) 50 MCG/ACT nasal spray SHAKE LQ AND U 1 SPR IEN BID PRN  . promethazine (PHENERGAN) 25 MG tablet Take 1 tablet (25 mg total) by mouth every 6 (six) hours as needed for nausea or vomiting.  . sertraline (ZOLOFT) 100 MG tablet Take 1.5 tablets (150 mg total) by mouth daily.   No facility-administered encounter medications on file as of 04/28/2022.    No results found for this or any previous visit (from the past 2160 hour(s)).   Psychiatric Specialty Exam: Physical Exam  Review of Systems  There were no vitals taken for this visit.There is no height or weight on file to calculate BMI.  General Appearance: {Appearance:22683}  Eye Contact:  {BHH EYE CONTACT:22684}  Speech:  {Speech:22685}  Volume:  {Volume (PAA):22686}  Mood:  {BHH MOOD:22306}  Affect:  {Affect (PAA):22687}  Thought Process:  {Thought Process (PAA):22688}  Orientation:  {BHH ORIENTATION (PAA):22689}  Thought Content:  {Thought Content:22690}  Suicidal Thoughts:  {ST/HT (PAA):22692}  Homicidal Thoughts:  {ST/HT (PAA):22692}   Memory:  {BHH MEMORY:22881}  Judgement:  {Judgement (PAA):22694}  Insight:  {Insight (PAA):22695}  Psychomotor Activity:  {Psychomotor (PAA):22696}  Concentration:  {Concentration:21399}  Recall:  {BHH GOOD/FAIR/POOR:22877}  Fund of Knowledge:  {BHH GOOD/FAIR/POOR:22877}  Language:  {BHH GOOD/FAIR/POOR:22877}  Akathisia:  {BHH YES OR NO:22294}  Handed:  {Handed:22697}  AIMS (if indicated):     Assets:  {Assets (PAA):22698}  ADL's:  {BHH XO:4411959  Cognition:  {chl bhh cognition:304700322}  Sleep:  ***     Assessment/Plan: No diagnosis found.  ***   Follow Up Instructions:     I discussed the assessment and treatment plan with the patient. The patient was provided an opportunity to ask questions and all were answered. The patient agreed with the plan and demonstrated an understanding of the instructions.   The patient was advised to call back or seek an in-person evaluation if the symptoms worsen or if the condition fails to improve as anticipated.    Collaboration of Care: {BH OP Collaboration of Care:21014065}  Patient/Guardian was advised Release of Information must be obtained prior to any record release in order to collaborate their care with an outside provider. Patient/Guardian was advised if they have not already done so to contact the registration department to sign all necessary forms in order for [place] to release information regarding their care.   Consent: Patient/Guardian gives verbal consent for treatment and assignment of benefits for services provided during this visit. Patient/Guardian expressed understanding and agreed to proceed.     I provided *** minutes of non face to face time during this  encounter.  Kathlee Nations, MD 04/28/2022

## 2022-04-28 NOTE — Progress Notes (Signed)
No show

## 2022-08-30 ENCOUNTER — Encounter (HOSPITAL_BASED_OUTPATIENT_CLINIC_OR_DEPARTMENT_OTHER): Payer: Self-pay

## 2022-08-30 ENCOUNTER — Emergency Department (HOSPITAL_BASED_OUTPATIENT_CLINIC_OR_DEPARTMENT_OTHER)
Admission: EM | Admit: 2022-08-30 | Discharge: 2022-08-30 | Disposition: A | Discharge status: Home / Self Care | Payer: No Typology Code available for payment source | Attending: Emergency Medicine | Admitting: Emergency Medicine

## 2022-08-30 ENCOUNTER — Emergency Department (HOSPITAL_BASED_OUTPATIENT_CLINIC_OR_DEPARTMENT_OTHER): Payer: No Typology Code available for payment source

## 2022-08-30 DIAGNOSIS — R109 Unspecified abdominal pain: Secondary | ICD-10-CM

## 2022-08-30 DIAGNOSIS — N132 Hydronephrosis with renal and ureteral calculous obstruction: Secondary | ICD-10-CM | POA: Diagnosis not present

## 2022-08-30 DIAGNOSIS — R112 Nausea with vomiting, unspecified: Secondary | ICD-10-CM

## 2022-08-30 DIAGNOSIS — N200 Calculus of kidney: Secondary | ICD-10-CM

## 2022-08-30 DIAGNOSIS — R1031 Right lower quadrant pain: Secondary | ICD-10-CM | POA: Diagnosis present

## 2022-08-30 LAB — CBC WITH DIFFERENTIAL/PLATELET
Abs Immature Granulocytes: 0.06 10*3/uL (ref 0.00–0.07)
Basophils Absolute: 0.1 10*3/uL (ref 0.0–0.1)
Basophils Relative: 1 %
Eosinophils Absolute: 0.1 10*3/uL (ref 0.0–0.5)
Eosinophils Relative: 0 %
HCT: 45.3 % (ref 39.0–52.0)
Hemoglobin: 16.1 g/dL (ref 13.0–17.0)
Immature Granulocytes: 0 %
Lymphocytes Relative: 24 %
Lymphs Abs: 4.3 10*3/uL — ABNORMAL HIGH (ref 0.7–4.0)
MCH: 30.9 pg (ref 26.0–34.0)
MCHC: 35.5 g/dL (ref 30.0–36.0)
MCV: 86.9 fL (ref 80.0–100.0)
Monocytes Absolute: 1 10*3/uL (ref 0.1–1.0)
Monocytes Relative: 6 %
Neutro Abs: 12.2 10*3/uL — ABNORMAL HIGH (ref 1.7–7.7)
Neutrophils Relative %: 69 %
Platelets: 384 10*3/uL (ref 150–400)
RBC: 5.21 MIL/uL (ref 4.22–5.81)
RDW: 13.2 % (ref 11.5–15.5)
WBC: 17.7 10*3/uL — ABNORMAL HIGH (ref 4.0–10.5)
nRBC: 0 % (ref 0.0–0.2)

## 2022-08-30 LAB — URINALYSIS, ROUTINE W REFLEX MICROSCOPIC
Bilirubin Urine: NEGATIVE
Glucose, UA: NEGATIVE mg/dL
Ketones, ur: 15 mg/dL — AB
Leukocytes,Ua: NEGATIVE
Nitrite: NEGATIVE
Protein, ur: 100 mg/dL — AB
Specific Gravity, Urine: 1.01 (ref 1.005–1.030)
pH: >=9 (ref 5.0–8.0)

## 2022-08-30 LAB — COMPREHENSIVE METABOLIC PANEL
ALT: 20 U/L (ref 0–44)
AST: 23 U/L (ref 15–41)
Albumin: 4.5 g/dL (ref 3.5–5.0)
Alkaline Phosphatase: 57 U/L (ref 38–126)
Anion gap: 15 (ref 5–15)
BUN: 14 mg/dL (ref 6–20)
CO2: 25 mmol/L (ref 22–32)
Calcium: 9.5 mg/dL (ref 8.9–10.3)
Chloride: 99 mmol/L (ref 98–111)
Creatinine, Ser: 1.3 mg/dL — ABNORMAL HIGH (ref 0.61–1.24)
GFR, Estimated: >60 mL/min (ref >60)
Glucose, Bld: 117 mg/dL — ABNORMAL HIGH (ref 70–99)
Potassium: 3.9 mmol/L (ref 3.5–5.1)
Sodium: 139 mmol/L (ref 135–145)
Total Bilirubin: 1.2 mg/dL (ref 0.3–1.2)
Total Protein: 8.1 g/dL (ref 6.5–8.1)

## 2022-08-30 LAB — URINALYSIS, MICROSCOPIC (REFLEX): WBC, UA: NONE SEEN WBC/hpf (ref 0–5)

## 2022-08-30 LAB — LIPASE, BLOOD: Lipase: 105 U/L — ABNORMAL HIGH (ref 11–51)

## 2022-08-30 LAB — CBG MONITORING, ED: Glucose-Capillary: 113 mg/dL — ABNORMAL HIGH (ref 70–99)

## 2022-08-30 MED ORDER — ONDANSETRON 4 MG PO TBDP: 4.0000 mg | ORAL_TABLET | Freq: Three times a day (TID) | ORAL | 0 refills | Status: AC | PRN

## 2022-08-30 MED ORDER — MORPHINE SULFATE (PF) 4 MG/ML IV SOLN
6.0000 mg | Freq: Once | INTRAVENOUS | Status: AC
  Administered 2022-08-30: 6 mg via INTRAVENOUS
  Filled 2022-08-30: qty 2

## 2022-08-30 MED ORDER — HYDROMORPHONE HCL 1 MG/ML IJ SOLN
1.0000 mg | Freq: Once | INTRAMUSCULAR | Status: AC
  Administered 2022-08-30: 1 mg via INTRAVENOUS
  Filled 2022-08-30: qty 1

## 2022-08-30 MED ORDER — KETOROLAC TROMETHAMINE 15 MG/ML IJ SOLN
15.0000 mg | Freq: Once | INTRAMUSCULAR | Status: AC
  Administered 2022-08-30: 15 mg via INTRAVENOUS
  Filled 2022-08-30: qty 1

## 2022-08-30 MED ORDER — IOHEXOL 300 MG/ML  SOLN
100.0000 mL | Freq: Once | INTRAMUSCULAR | Status: AC | PRN
  Administered 2022-08-30: 100 mL via INTRAVENOUS

## 2022-08-30 MED ORDER — TAMSULOSIN HCL 0.4 MG PO CAPS: 0.4000 mg | ORAL_CAPSULE | Freq: Every day | ORAL | 0 refills | Status: AC

## 2022-08-30 MED ORDER — DIPHENHYDRAMINE HCL 25 MG PO CAPS
25.0000 mg | ORAL_CAPSULE | Freq: Once | ORAL | Status: AC
  Administered 2022-08-30: 25 mg via ORAL
  Filled 2022-08-30: qty 1

## 2022-08-30 MED ORDER — ONDANSETRON HCL 4 MG/2ML IJ SOLN
4.0000 mg | Freq: Once | INTRAMUSCULAR | Status: AC
  Administered 2022-08-30: 4 mg via INTRAVENOUS
  Filled 2022-08-30: qty 2

## 2022-08-30 MED ORDER — MORPHINE SULFATE (PF) 4 MG/ML IV SOLN: 6.0000 mg | Freq: Once | INTRAVENOUS | Status: DC

## 2022-08-30 MED ORDER — OXYCODONE HCL 5 MG PO TABS: 5.0000 mg | ORAL_TABLET | ORAL | 0 refills | Status: AC | PRN

## 2022-08-30 MED ORDER — LACTATED RINGERS IV BOLUS
1000.0000 mL | Freq: Once | INTRAVENOUS | Status: AC
  Administered 2022-08-30: 1000 mL via INTRAVENOUS

## 2022-08-30 MED ORDER — TAMSULOSIN HCL 0.4 MG PO CAPS
0.4000 mg | ORAL_CAPSULE | ORAL | Status: AC
  Administered 2022-08-30: 0.4 mg via ORAL
  Filled 2022-08-30: qty 1

## 2022-08-30 MED ORDER — LORAZEPAM 2 MG/ML IJ SOLN
1.0000 mg | Freq: Once | INTRAMUSCULAR | Status: AC
  Administered 2022-08-30: 1 mg via INTRAVENOUS
  Filled 2022-08-30: qty 1

## 2022-08-30 NOTE — ED Notes (Signed)
Pt still unable to void, another bolus ordered

## 2022-08-30 NOTE — ED Triage Notes (Signed)
C/o RLQ pain 40 min pta with diaphoresis and nausea. Appears uncomfortable. C/o testicle/ groin pain

## 2022-08-30 NOTE — ED Notes (Signed)
Report given to Tanesha RN.

## 2022-08-30 NOTE — ED Notes (Signed)
Patient transported to CT 

## 2022-08-30 NOTE — Discharge Instructions (Addendum)
You were seen for your kidney stone in the emergency department.    At home, please take Tylenol and ibuprofen for your pain. You may also take the oxycodone we have prescribed you for any breakthrough pain that may have.  Do not take this before driving or operating heavy machinery.  Do not take this medication with alcohol.  Use the flowmax we have give you every day until the kidney stone passes. Please also strain your urine to collect the kidney stone. Store it in a container and take it to your urologist for analysis.   Follow-up with urology in a week to discuss your symptoms.   Return immediately to the emergency department if you experience any of the following: fever, unbearable pain, urinary retention, or any other concerning symptoms.    Thank you for visiting our Emergency Department. It was a pleasure taking care of you today.   

## 2022-08-30 NOTE — ED Provider Notes (Signed)
[facility] Provider Note   CSN: [id number] Arrival date & time: 08/30/22  1830     History  Chief Complaint  Patient presents with   Abdominal Pain    Peter Foster is a 52 y.o. male.  52 year old male with a history of chronic lower abdominal pain who presents to the emergency department with abdominal pain.  Patient reports that just prior to arrival he started experiencing abrupt and severe onset right lower quadrant abdominal pain that has been constant.  Says he started getting diaphoretic and started having nonbloody nonbilious emesis.  No diarrhea.  Thinks he may have some bilateral testicular pain.  No hematuria, dysuria, or frequency.  No abdominal surgeries.  No known history of kidney stones.  Drinks alcohol and smokes marijuana occasionally.       Home Medications Prior to Admission medications   Medication Sig Start Date End Date Taking? Authorizing Provider  clonazePAM (KLONOPIN) 0.5 MG tablet TAKE ONE TABLET BY MOUTH ONE TIME DAILY AS NEEDED FOR ANXIETY 03/03/22   Elsie Lincoln, MD  ondansetron (ZOFRAN-ODT) 4 MG disintegrating tablet Take 1 tablet (4 mg total) by mouth every 8 (eight) hours as needed for nausea or vomiting. 08/30/22  Yes Rondel Baton, MD  oxyCODONE (ROXICODONE) 5 MG immediate release tablet Take 1 tablet (5 mg total) by mouth every 4 (four) hours as needed for severe pain. 08/30/22  Yes Rondel Baton, MD  tamsulosin (FLOMAX) 0.4 MG CAPS capsule Take 1 capsule (0.4 mg total) by mouth daily. 08/30/22  Yes Rondel Baton, MD  dicyclomine (BENTYL) 20 MG tablet Take 1 tablet (20 mg total) by mouth 2 (two) times daily. 05/21/14   Palumbo, April, MD  fluticasone (FLONASE) 50 MCG/ACT nasal spray SHAKE LQ AND U 1 SPR IEN BID PRN 02/25/17   [provider]  promethazine (PHENERGAN) 25 MG tablet Take 1 tablet (25 mg total) by mouth every 6 (six) hours as needed for nausea or vomiting. 04/14/21    Mesner, Barbara Cower, MD  sertraline (ZOLOFT) 100 MG tablet Take 1.5 tablets (150 mg total) by mouth daily. 10/26/21   Arfeen, Phillips Grout, MD      Allergies    Gabapentin    Review of Systems   Review of Systems  Physical Exam Updated Vital Signs BP (!) 146/84   Pulse 68   Temp 97.8 F (36.6 C) (Oral)   Resp 18   Ht 6' (1.829 m)   Wt 104.3 kg   SpO2 94%   BMI 31.19 kg/m  Physical Exam Vitals and nursing note reviewed.  Constitutional:      General: He is not in acute distress.    Appearance: He is well-developed.     Comments: Uncomfortable appearing with emesis covering his clothes  HENT:     Head: Normocephalic and atraumatic.     Right Ear: External ear normal.     Left Ear: External ear normal.     Nose: Nose normal.  Eyes:     Extraocular Movements: Extraocular movements intact.     Conjunctiva/sclera: Conjunctivae normal.     Pupils: Pupils are equal, round, and reactive to light.  Cardiovascular:     Rate and Rhythm: Normal rate and regular rhythm.  Pulmonary:     Effort: Pulmonary effort is normal. No respiratory distress.  Abdominal:     General: There is no distension.     Palpations: Abdomen is soft. There is no mass.  Tenderness: There is abdominal tenderness (Right lower quadrant). There is no guarding.  Genitourinary:    Penis: Normal.      Testes: Normal.  Musculoskeletal:     Cervical back: Normal range of motion and neck supple.  Skin:    General: Skin is warm and dry.  Neurological:     Mental Status: He is alert. Mental status is at baseline.  Psychiatric:        Mood and Affect: Mood normal.        Behavior: Behavior normal.     ED Results / Procedures / Treatments   Labs (all labs ordered are listed, but only abnormal results are displayed) Labs Reviewed  COMPREHENSIVE METABOLIC PANEL - Abnormal; Notable for the following components:      Result Value   Glucose, Bld 117 (*)    Creatinine, Ser 1.30 (*)    All other components within  normal limits  LIPASE, BLOOD - Abnormal; Notable for the following components:   Lipase 105 (*)    All other components within normal limits  CBC WITH DIFFERENTIAL/PLATELET - Abnormal; Notable for the following components:   WBC 17.7 (*)    Neutro Abs 12.2 (*)    Lymphs Abs 4.3 (*)    All other components within normal limits  URINALYSIS, ROUTINE W REFLEX MICROSCOPIC - Abnormal; Notable for the following components:   APPearance HAZY (*)    Hgb urine dipstick LARGE (*)    Ketones, ur 15 (*)    Protein, ur 100 (*)    All other components within normal limits  URINALYSIS, MICROSCOPIC (REFLEX) - Abnormal; Notable for the following components:   Bacteria, UA RARE (*)    All other components within normal limits  CBG MONITORING, ED - Abnormal; Notable for the following components:   Glucose-Capillary 113 (*)    All other components within normal limits    EKG None  Radiology CT ABDOMEN PELVIS W CONTRAST  Result Date: 08/30/2022 CLINICAL DATA:  RLQ pain EXAM: CT ABDOMEN AND PELVIS WITH CONTRAST TECHNIQUE: Multidetector CT imaging of the abdomen and pelvis was performed using the standard protocol following bolus administration of intravenous contrast. RADIATION DOSE REDUCTION: This exam was performed according to the departmental dose-optimization program which includes automated exposure control, adjustment of the mA and/or kV according to patient size and/or use of iterative reconstruction technique. CONTRAST:  OMNIPAQUE IOHEXOL 300 MG/ML  SOLN COMPARISON:  February seventh 2023 FINDINGS: Lower chest: No acute abnormality. Hepatobiliary: No suspicious focal hepatic lesion. Gallbladder is unremarkable. No extrahepatic or intrahepatic biliary ductal dilation. Pancreas: Unremarkable. No pancreatic ductal dilatation or surrounding inflammatory changes. Spleen: Normal in size without focal abnormality. Adrenals/Urinary Tract: Adrenal glands are unremarkable. There is a 4 mm ureterolithiasis  at the RIGHT UVJ with mild upstream hydroureteronephrosis and delayed renal excretion. Additional punctate nonobstructing RIGHT-sided nephrolithiasis. No LEFT-sided hydronephrosis. Bladder is decompressed. Stomach/Bowel: No evidence of bowel obstruction. Scattered diverticulosis without evidence of acute diverticulitis. Appendix is normal. Small hiatal hernia Vascular/Lymphatic: No significant vascular findings are present. No enlarged abdominal or pelvic lymph nodes. Circumaortic LEFT renal vein. Reproductive: Prostate is present. Other: No free air or free fluid. Musculoskeletal: No acute or significant osseous findings. IMPRESSION: There is a 4 mm ureterolithiasis at the RIGHT UVJ with mild upstream hydroureteronephrosis and obstructive physiology. Electronically Signed   By: Meda Klinefelter M.D.   On: 08/30/2022 20:08    Procedures Procedures    Medications Ordered in ED Medications  morphine (PF) 4 MG/ML injection  6 mg (6 mg Intravenous Given 08/30/22 1853)  ondansetron (ZOFRAN) injection 4 mg (4 mg Intravenous Given 08/30/22 1852)  lactated ringers bolus 1,000 mL (0 mLs Intravenous Stopped 08/30/22 2021)  ketorolac (TORADOL) 15 MG/ML injection 15 mg (15 mg Intravenous Given 08/30/22 1905)  LORazepam (ATIVAN) injection 1 mg (1 mg Intravenous Given 08/30/22 1918)  iohexol (OMNIPAQUE) 300 MG/ML solution 100 mL (100 mLs Intravenous Contrast Given 08/30/22 1937)  HYDROmorphone (DILAUDID) injection 1 mg (1 mg Intravenous Given 08/30/22 2001)  lactated ringers bolus 1,000 mL (0 mLs Intravenous Stopped 08/30/22 2157)  tamsulosin (FLOMAX) capsule 0.4 mg (0.4 mg Oral Given 08/30/22 2157)  HYDROmorphone (DILAUDID) injection 1 mg (1 mg Intravenous Given 08/30/22 2157)  diphenhydrAMINE (BENADRYL) capsule 25 mg (25 mg Oral Given 08/30/22 2308)    ED Course/ Medical Decision Making/ A&P Clinical Course as of 08/30/22 2345  Mon Aug 30, 2022  1958 Creatinine(!): 1.30 Baseline of 1.06 [RP]  2014 CT ABDOMEN  PELVIS W CONTRAST IMPRESSION: There is a 4 mm ureterolithiasis at the RIGHT UVJ with mild upstream hydroureteronephrosis and obstructive physiology. [RP]  2212 Dr Liliane Shi from urology consulted.  States that the patient should call the office and tell them he was in the ER and they will see him this week. [RP]    Clinical Course User Index [RP] Rondel Baton, MD                             Medical Decision Making Amount and/or Complexity of Data Reviewed Labs: ordered. Decision-making details documented in ED Course. Radiology: ordered. Decision-making details documented in ED Course.  Risk Prescription drug management.   Peter Foster is a 52 y.o. male with comorbidities that complicate the patient evaluation including chronic lower abdominal pain who presents emergency department with lower abdominal pain in the right lower quadrant  Initial Ddx:  Appendicitis, kidney stone, testicular torsion, inguinal hernia  MDM/Course:  Feel the patient is likely having appendicitis versus kidney stone based on his symptoms.  A CT of the abdomen pelvis was obtained with IV contrast which showed a distal 4 mm stone with some hydroureter.  Did not have any AKI.  Urinalysis was not consistent with UTI.  Was given several rounds of pain medication while in the emergency department.  Did develop some itching but no other signs of anaphylaxis or allergic reaction was treated with Benadryl.  Was able to tolerate p.o. and is feeling much better at the time of discharge.  Was instructed to take Tylenol, ibuprofen, and oxycodone for any breakthrough pain along with Flomax and strain his urine.  Was discussed with urology who will see him in clinic this week.    This patient presents to the ED for concern of complaints listed in HPI, this involves an extensive number of treatment options, and is a complaint that carries with it a high risk of complications and morbidity. Disposition including potential need  for admission considered.   Dispo: DC Home. Return precautions discussed including, but not limited to, those listed in the AVS. Allowed pt time to ask questions which were answered fully prior to dc.  Additional history obtained from spouse Records reviewed Outpatient Clinic Notes The following labs were independently interpreted: Urinalysis and show no acute abnormality I independently reviewed the following imaging with scope of interpretation limited to determining acute life threatening conditions related to emergency care: CT Abdomen/Pelvis and agree with the radiologist interpretation with the following  exceptions: none I personally reviewed and interpreted cardiac monitoring: normal sinus rhythm  I personally reviewed and interpreted the pt's EKG: see above for interpretation  I have reviewed the patients home medications and made adjustments as needed Consults: Urology       Final Clinical Impression(s) / ED Diagnoses Final diagnoses:  Kidney stone  Flank pain  Nausea and vomiting, unspecified vomiting type    Rx / DC Orders ED Discharge Orders          Ordered    oxyCODONE (ROXICODONE) 5 MG immediate release tablet  Every 4 hours PRN        08/30/22 2255    ondansetron (ZOFRAN-ODT) 4 MG disintegrating tablet  Every 8 hours PRN        08/30/22 2255    tamsulosin (FLOMAX) 0.4 MG CAPS capsule  Daily        08/30/22 2308              Rondel Baton, MD 08/30/22 2345

## 2022-08-30 NOTE — ED Notes (Signed)
Pain has improved now, wife at bedside

## 2022-09-01 ENCOUNTER — Emergency Department (HOSPITAL_BASED_OUTPATIENT_CLINIC_OR_DEPARTMENT_OTHER)
Admission: EM | Admit: 2022-09-01 | Discharge: 2022-09-01 | Disposition: A | Discharge status: Home / Self Care | Payer: No Typology Code available for payment source | Attending: Emergency Medicine | Admitting: Emergency Medicine

## 2022-09-01 ENCOUNTER — Encounter (HOSPITAL_BASED_OUTPATIENT_CLINIC_OR_DEPARTMENT_OTHER): Payer: Self-pay | Admitting: Emergency Medicine

## 2022-09-01 ENCOUNTER — Emergency Department (HOSPITAL_BASED_OUTPATIENT_CLINIC_OR_DEPARTMENT_OTHER)
Admission: EM | Admit: 2022-09-01 | Discharge: 2022-09-02 | Discharge status: Against Medical Advice | Payer: No Typology Code available for payment source | Source: Home / Self Care | Attending: Emergency Medicine | Admitting: Emergency Medicine

## 2022-09-01 ENCOUNTER — Other Ambulatory Visit: Payer: Self-pay

## 2022-09-01 ENCOUNTER — Encounter (HOSPITAL_BASED_OUTPATIENT_CLINIC_OR_DEPARTMENT_OTHER): Payer: Self-pay

## 2022-09-01 DIAGNOSIS — R451 Restlessness and agitation: Secondary | ICD-10-CM | POA: Insufficient documentation

## 2022-09-01 DIAGNOSIS — N23 Unspecified renal colic: Secondary | ICD-10-CM | POA: Insufficient documentation

## 2022-09-01 DIAGNOSIS — Z5329 Procedure and treatment not carried out because of patient's decision for other reasons: Secondary | ICD-10-CM | POA: Insufficient documentation

## 2022-09-01 DIAGNOSIS — N200 Calculus of kidney: Secondary | ICD-10-CM | POA: Insufficient documentation

## 2022-09-01 DIAGNOSIS — R109 Unspecified abdominal pain: Secondary | ICD-10-CM | POA: Diagnosis present

## 2022-09-01 LAB — URINALYSIS, MICROSCOPIC (REFLEX)

## 2022-09-01 LAB — CBC WITH DIFFERENTIAL/PLATELET
Abs Immature Granulocytes: 0.05 10*3/uL (ref 0.00–0.07)
Basophils Absolute: 0 10*3/uL (ref 0.0–0.1)
Basophils Relative: 0 %
Eosinophils Absolute: 0.1 10*3/uL (ref 0.0–0.5)
Eosinophils Relative: 1 %
HCT: 40.7 % (ref 39.0–52.0)
Hemoglobin: 14.5 g/dL (ref 13.0–17.0)
Immature Granulocytes: 0 %
Lymphocytes Relative: 25 %
Lymphs Abs: 3.5 10*3/uL (ref 0.7–4.0)
MCH: 30.9 pg (ref 26.0–34.0)
MCHC: 35.6 g/dL (ref 30.0–36.0)
MCV: 86.8 fL (ref 80.0–100.0)
Monocytes Absolute: 0.7 10*3/uL (ref 0.1–1.0)
Monocytes Relative: 5 %
Neutro Abs: 9.6 10*3/uL — ABNORMAL HIGH (ref 1.7–7.7)
Neutrophils Relative %: 69 %
Platelets: 334 10*3/uL (ref 150–400)
RBC: 4.69 MIL/uL (ref 4.22–5.81)
RDW: 13.3 % (ref 11.5–15.5)
WBC: 14 10*3/uL — ABNORMAL HIGH (ref 4.0–10.5)
nRBC: 0 % (ref 0.0–0.2)

## 2022-09-01 LAB — URINALYSIS, ROUTINE W REFLEX MICROSCOPIC
Bilirubin Urine: NEGATIVE
Glucose, UA: NEGATIVE mg/dL
Ketones, ur: NEGATIVE mg/dL
Leukocytes,Ua: NEGATIVE
Nitrite: NEGATIVE
Protein, ur: 30 mg/dL — AB
Specific Gravity, Urine: 1.025 (ref 1.005–1.030)
pH: 6.5 (ref 5.0–8.0)

## 2022-09-01 LAB — BASIC METABOLIC PANEL
Anion gap: 14 (ref 5–15)
BUN: 17 mg/dL (ref 6–20)
CO2: 24 mmol/L (ref 22–32)
Calcium: 9.1 mg/dL (ref 8.9–10.3)
Chloride: 99 mmol/L (ref 98–111)
Creatinine, Ser: 1.13 mg/dL (ref 0.61–1.24)
GFR, Estimated: >60 mL/min (ref >60)
Glucose, Bld: 112 mg/dL — ABNORMAL HIGH (ref 70–99)
Potassium: 3.9 mmol/L (ref 3.5–5.1)
Sodium: 137 mmol/L (ref 135–145)

## 2022-09-01 MED ORDER — ONDANSETRON HCL 4 MG/2ML IJ SOLN
4.0000 mg | Freq: Once | INTRAMUSCULAR | Status: AC
  Administered 2022-09-01: 4 mg via INTRAVENOUS
  Filled 2022-09-01: qty 2

## 2022-09-01 MED ORDER — HYDROMORPHONE HCL 1 MG/ML IJ SOLN
1.0000 mg | Freq: Once | INTRAMUSCULAR | Status: AC
  Administered 2022-09-01: 1 mg via INTRAVENOUS
  Filled 2022-09-01: qty 1

## 2022-09-01 MED ORDER — SODIUM CHLORIDE 0.9 % IV BOLUS
1000.0000 mL | Freq: Once | INTRAVENOUS | Status: AC
  Administered 2022-09-01: 1000 mL via INTRAVENOUS

## 2022-09-01 MED ORDER — NAPROXEN 375 MG PO TABS: 375.0000 mg | ORAL_TABLET | Freq: Two times a day (BID) | ORAL | 0 refills | Status: AC

## 2022-09-01 MED ORDER — DICYCLOMINE HCL 20 MG PO TABS: 20.0000 mg | ORAL_TABLET | Freq: Two times a day (BID) | ORAL | 0 refills | Status: DC

## 2022-09-01 MED ORDER — KETOROLAC TROMETHAMINE 30 MG/ML IJ SOLN
30.0000 mg | Freq: Once | INTRAMUSCULAR | Status: AC
  Administered 2022-09-01: 30 mg via INTRAVENOUS
  Filled 2022-09-01: qty 1

## 2022-09-01 MED ORDER — DICYCLOMINE HCL 20 MG PO TABS: 20.0000 mg | ORAL_TABLET | Freq: Two times a day (BID) | ORAL | 0 refills | Status: AC
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
## 2022-09-01 NOTE — ED Triage Notes (Signed)
Pt states has had pain since last visit 6/24. Worse about an hour ago. States thinks is more than kidney stone and may be constipated.

## 2022-09-01 NOTE — ED Provider Notes (Signed)
[place] [facility] Provider Note   CSN: [id number] Arrival date & time: 09/01/22  0630     History  Chief Complaint  Patient presents with   Abdominal Pain    Dietrich Samuelson is a 52 y.o. male.  Patient here with ongoing right flank/groin pain.  Diagnosed with a kidney stone a day or 2 ago.  Pain medication at home helping a little bit but he still having severe episodes of pain.  Feels constipated.  Denies any fever or chills.  Denies any chest pain shortness of breath weakness numbness or tingling.  The history is provided by the patient.       Home Medications Prior to Admission medications   Medication Sig Start Date End Date Taking? Authorizing Provider  clonazePAM (KLONOPIN) 0.5 MG tablet TAKE ONE TABLET BY MOUTH ONE TIME DAILY AS NEEDED FOR ANXIETY 03/03/22   Elsie Lincoln, MD  dicyclomine (BENTYL) 20 MG tablet Take 1 tablet (20 mg total) by mouth 2 (two) times daily for 10 doses. 09/01/22 09/06/22  Luther Newhouse, DO  fluticasone (FLONASE) 50 MCG/ACT nasal spray SHAKE LQ AND U 1 SPR IEN BID PRN 02/25/17   [provider]  ondansetron (ZOFRAN-ODT) 4 MG disintegrating tablet Take 1 tablet (4 mg total) by mouth every 8 (eight) hours as needed for nausea or vomiting. 08/30/22   Rondel Baton, MD  oxyCODONE (ROXICODONE) 5 MG immediate release tablet Take 1 tablet (5 mg total) by mouth every 4 (four) hours as needed for severe pain. 08/30/22   Rondel Baton, MD  promethazine (PHENERGAN) 25 MG tablet Take 1 tablet (25 mg total) by mouth every 6 (six) hours as needed for nausea or vomiting. 04/14/21   Mesner, Barbara Cower, MD  sertraline (ZOLOFT) 100 MG tablet Take 1.5 tablets (150 mg total) by mouth daily. 10/26/21   Arfeen, Phillips Grout, MD  tamsulosin (FLOMAX) 0.4 MG CAPS capsule Take 1 capsule (0.4 mg total) by mouth daily. 08/30/22   Rondel Baton, MD      Allergies    Gabapentin    Review of Systems   Review of  Systems  Physical Exam Updated Vital Signs BP (!) 152/131 (BP Location: Right Arm)   Pulse 63   Temp 98.5 F (36.9 C) (Oral)   Resp 20   Ht 6' (1.829 m)   Wt 104.3 kg   SpO2 100%   BMI 31.19 kg/m  Physical Exam Vitals and nursing note reviewed.  Constitutional:      General: He is in acute distress.     Appearance: He is well-developed.  HENT:     Head: Normocephalic and atraumatic.  Eyes:     Extraocular Movements: Extraocular movements intact.     Conjunctiva/sclera: Conjunctivae normal.  Cardiovascular:     Rate and Rhythm: Normal rate and regular rhythm.     Heart sounds: Normal heart sounds. No murmur heard. Pulmonary:     Effort: Pulmonary effort is normal. No respiratory distress.     Breath sounds: Normal breath sounds.  Abdominal:     Palpations: Abdomen is soft.     Tenderness: There is no abdominal tenderness.  Musculoskeletal:        General: No swelling.     Cervical back: Neck supple.  Skin:    General: Skin is warm and dry.     Capillary Refill: Capillary refill takes less than 2 seconds.  Neurological:     Mental Status: He is alert.  Psychiatric:        Mood and Affect: Mood normal.     ED Results / Procedures / Treatments   Labs (all labs ordered are listed, but only abnormal results are displayed) Labs Reviewed  CBC WITH DIFFERENTIAL/PLATELET - Abnormal; Notable for the following components:      Result Value   WBC 14.0 (*)    Neutro Abs 9.6 (*)    All other components within normal limits  BASIC METABOLIC PANEL - Abnormal; Notable for the following components:   Glucose, Bld 112 (*)    All other components within normal limits  URINALYSIS, ROUTINE W REFLEX MICROSCOPIC - Abnormal; Notable for the following components:   Hgb urine dipstick LARGE (*)    Protein, ur 30 (*)    All other components within normal limits  URINALYSIS, MICROSCOPIC (REFLEX) - Abnormal; Notable for the following components:   Bacteria, UA RARE (*)    All other  components within normal limits    EKG None  Radiology CT ABDOMEN PELVIS W CONTRAST  Result Date: 08/30/2022 CLINICAL DATA:  RLQ pain EXAM: CT ABDOMEN AND PELVIS WITH CONTRAST TECHNIQUE: Multidetector CT imaging of the abdomen and pelvis was performed using the standard protocol following bolus administration of intravenous contrast. RADIATION DOSE REDUCTION: This exam was performed according to the departmental dose-optimization program which includes automated exposure control, adjustment of the mA and/or kV according to patient size and/or use of iterative reconstruction technique. CONTRAST:  OMNIPAQUE IOHEXOL 300 MG/ML  SOLN COMPARISON:  February seventh 2023 FINDINGS: Lower chest: No acute abnormality. Hepatobiliary: No suspicious focal hepatic lesion. Gallbladder is unremarkable. No extrahepatic or intrahepatic biliary ductal dilation. Pancreas: Unremarkable. No pancreatic ductal dilatation or surrounding inflammatory changes. Spleen: Normal in size without focal abnormality. Adrenals/Urinary Tract: Adrenal glands are unremarkable. There is a 4 mm ureterolithiasis at the RIGHT UVJ with mild upstream hydroureteronephrosis and delayed renal excretion. Additional punctate nonobstructing RIGHT-sided nephrolithiasis. No LEFT-sided hydronephrosis. Bladder is decompressed. Stomach/Bowel: No evidence of bowel obstruction. Scattered diverticulosis without evidence of acute diverticulitis. Appendix is normal. Small hiatal hernia Vascular/Lymphatic: No significant vascular findings are present. No enlarged abdominal or pelvic lymph nodes. Circumaortic LEFT renal vein. Reproductive: Prostate is present. Other: No free air or free fluid. Musculoskeletal: No acute or significant osseous findings. IMPRESSION: There is a 4 mm ureterolithiasis at the RIGHT UVJ with mild upstream hydroureteronephrosis and obstructive physiology. Electronically Signed   By: Meda Klinefelter M.D.   On: 08/30/2022 20:08     Procedures Procedures    Medications Ordered in ED Medications  sodium chloride 0.9 % bolus 1,000 mL (0 mLs Intravenous Stopped 09/01/22 0755)  ketorolac (TORADOL) 30 MG/ML injection 30 mg (30 mg Intravenous Given 09/01/22 0716)  HYDROmorphone (DILAUDID) injection 1 mg (1 mg Intravenous Given 09/01/22 0717)  ondansetron (ZOFRAN) injection 4 mg (4 mg Intravenous Given 09/01/22 0981)    ED Course/ Medical Decision Making/ A&P                             Medical Decision Making Amount and/or Complexity of Data Reviewed Labs: ordered.  Risk Prescription drug management.   Edder Bellanca is here with ongoing right flank pain.  Diagnosed with kidney stone day ago or so.  Overall suspect ongoing kidney stone pain.  Having maybe some constipation now.  I have no concern for other acute intra-abdominal process given history and physical.  Will do further pain management and recheck urinalysis  and kidney function.  I have low suspicion for infected stone as patient has normal vitals.  No fever.  Will give a dose of Dilaudid, Zofran, normal saline.  Will reevaluate.  Lab work per my review interpretation is unremarkable.  No significant leukocytosis or anemia or electrolyte abnormality.  Urinalysis negative for infection.  Pain is much improved.  Discussed further pain management with patient.  Discharged in good condition.  This chart was dictated using voice recognition software.  Despite best efforts to proofread,  errors can occur which can change the documentation meaning.         Final Clinical Impression(s) / ED Diagnoses Final diagnoses:  Kidney stone    Rx / DC Orders ED Discharge Orders          Ordered    dicyclomine (BENTYL) 20 MG tablet  2 times daily        09/01/22 0806              Virgina Norfolk, DO 09/01/22 [phone]

## 2022-09-01 NOTE — Discharge Instructions (Addendum)
Recommend 600 mg ibuprofen every 8 hours as needed for pain.  Take Zofran as needed for nausea and vomiting.  Take Roxicodone as prescribed.  Continue 1000 mg of Tylenol every 6 hours as needed for pain as well.  I prescribed you Bentyl to help with any spasms as well.  Recommend MiraLAX over-the-counter to help with constipation.  Can use 1-2 capfuls in 20 ounces of water daily to help.

## 2022-09-01 NOTE — ED Triage Notes (Signed)
Pt arrives with right sided flank pain that he has been seen for previously. Pt was diagnosed with a kidney stone. Per pt, nausea meds are not working and he cannot keep pain meds or liquids down.
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
## 2022-09-02 NOTE — ED Notes (Signed)
Primary RN unable to assess the pt due to the events that transpired earlier in the night when pt first came in.

## 2022-09-14 ENCOUNTER — Telehealth (HOSPITAL_COMMUNITY): Payer: Self-pay | Admitting: *Deleted

## 2022-09-14 NOTE — Telephone Encounter (Signed)
Writer returned pt VM regarding refilling Klonopin 0.5 mg tabs. As he has been having panic attacks recently. Pt last seen on 10/26/21 and last script e-scribed for #15 on 03/03/22 by Dr. Adrian Blackwater however do not see an encounter for that date. Pt also has h/o No shows FYI. Writer advised pt that he would need to schedule an appointment prior to receiving this medication and that Dr. Lolly Mustache would be notified as it is a controlled substance may have to wait for appointment as it has been almost a year since last visit. Please review and advise.

## 2022-09-14 NOTE — Telephone Encounter (Signed)
Please prescribe 10 tablets Klonopin 0.5 mg to take as needed and ask him to schedule appointment.

## 2022-09-15 ENCOUNTER — Other Ambulatory Visit (HOSPITAL_COMMUNITY): Payer: Self-pay | Admitting: *Deleted

## 2022-09-15 MED ORDER — CLONAZEPAM 0.5 MG PO TABS: 0.5000 mg | ORAL_TABLET | Freq: Every day | ORAL | 0 refills | Status: DC | PRN

## 2022-09-15 NOTE — Telephone Encounter (Signed)
Writer LVM with reminder to schedule appointment.

## 2022-10-04 ENCOUNTER — Other Ambulatory Visit (HOSPITAL_COMMUNITY): Payer: Self-pay | Admitting: *Deleted

## 2022-10-04 ENCOUNTER — Telehealth (HOSPITAL_COMMUNITY): Payer: Self-pay | Admitting: *Deleted

## 2022-10-04 MED ORDER — CLONAZEPAM 0.5 MG PO TABS: 0.5000 mg | ORAL_TABLET | Freq: Every day | ORAL | 0 refills | Status: DC | PRN

## 2022-10-04 NOTE — Telephone Encounter (Signed)
Pt called requesting a refill of the Klonopin 0.5 mg tabs. Pt last seen on 10/26/21 and has an appointment scheduled for 10/18/22. Last prescription sent for #10 on 09/15/22. Please review and advise.

## 2022-10-04 NOTE — Telephone Encounter (Signed)
Please send 3 tablets and recommend to keep his appointment

## 2022-10-18 ENCOUNTER — Telehealth (HOSPITAL_COMMUNITY): Payer: No Typology Code available for payment source | Admitting: Psychiatry

## 2022-10-18 ENCOUNTER — Telehealth (HOSPITAL_COMMUNITY): Payer: Self-pay | Admitting: Psychiatry

## 2022-10-18 ENCOUNTER — Telehealth (HOSPITAL_COMMUNITY): Payer: Self-pay

## 2022-10-18 NOTE — Telephone Encounter (Signed)
I can give a limited supply since it is written as needed for severe anxiety attacks , I will give 3 tablets or he can wait for Dr.Arfeen to get back in office and discuss with Dr.Arfeen. Please let me know.

## 2022-10-18 NOTE — Telephone Encounter (Signed)
Patient is calling to request a refill on Klonopin, his appointment was rescheduled for September. Please review and advise, thank you

## 2022-10-18 NOTE — Telephone Encounter (Signed)
Patient called and requested refills for both of his medications. Patient's next appointment is September 23.-Please advise

## 2022-10-19 NOTE — Telephone Encounter (Signed)
Noted  

## 2022-11-24 ENCOUNTER — Other Ambulatory Visit (HOSPITAL_COMMUNITY): Payer: Self-pay | Admitting: Psychiatry

## 2022-11-24 DIAGNOSIS — F331 Major depressive disorder, recurrent, moderate: Secondary | ICD-10-CM

## 2022-11-24 DIAGNOSIS — F4312 Post-traumatic stress disorder, chronic: Secondary | ICD-10-CM

## 2022-11-29 ENCOUNTER — Encounter (HOSPITAL_COMMUNITY): Payer: Self-pay | Admitting: Psychiatry

## 2022-11-29 ENCOUNTER — Telehealth (HOSPITAL_BASED_OUTPATIENT_CLINIC_OR_DEPARTMENT_OTHER): Payer: No Typology Code available for payment source | Admitting: Psychiatry

## 2022-11-29 VITALS — Wt 228.0 lb

## 2022-11-29 DIAGNOSIS — F410 Panic disorder [episodic paroxysmal anxiety] without agoraphobia: Secondary | ICD-10-CM

## 2022-11-29 DIAGNOSIS — F331 Major depressive disorder, recurrent, moderate: Secondary | ICD-10-CM

## 2022-11-29 DIAGNOSIS — F4312 Post-traumatic stress disorder, chronic: Secondary | ICD-10-CM | POA: Diagnosis not present

## 2022-11-29 MED ORDER — HYDROXYZINE HCL 10 MG PO TABS
10.0000 mg | ORAL_TABLET | Freq: Every evening | ORAL | 1 refills | Status: AC | PRN
Start: 2022-11-29 — End: ?

## 2022-11-29 MED ORDER — CLONAZEPAM 0.5 MG PO TABS
0.5000 mg | ORAL_TABLET | Freq: Every day | ORAL | 0 refills | Status: DC | PRN
Start: 2022-11-29 — End: 2023-01-31

## 2022-11-29 MED ORDER — SERTRALINE HCL 100 MG PO TABS: 150.0000 mg | ORAL_TABLET | Freq: Every day | ORAL | 1 refills | Status: DC
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
## 2023-01-17 ENCOUNTER — Other Ambulatory Visit (HOSPITAL_COMMUNITY): Payer: Self-pay | Admitting: Psychiatry

## 2023-01-17 DIAGNOSIS — F410 Panic disorder [episodic paroxysmal anxiety] without agoraphobia: Secondary | ICD-10-CM

## 2023-01-31 ENCOUNTER — Telehealth (HOSPITAL_BASED_OUTPATIENT_CLINIC_OR_DEPARTMENT_OTHER): Payer: No Typology Code available for payment source | Admitting: Psychiatry

## 2023-01-31 ENCOUNTER — Encounter (HOSPITAL_COMMUNITY): Payer: Self-pay | Admitting: Psychiatry

## 2023-01-31 VITALS — Wt 220.0 lb

## 2023-01-31 DIAGNOSIS — F410 Panic disorder [episodic paroxysmal anxiety] without agoraphobia: Secondary | ICD-10-CM | POA: Diagnosis not present

## 2023-01-31 DIAGNOSIS — F331 Major depressive disorder, recurrent, moderate: Secondary | ICD-10-CM

## 2023-01-31 DIAGNOSIS — F4312 Post-traumatic stress disorder, chronic: Secondary | ICD-10-CM | POA: Diagnosis not present

## 2023-01-31 MED ORDER — SERTRALINE HCL 100 MG PO TABS: 200.0000 mg | ORAL_TABLET | Freq: Every day | ORAL | 1 refills | Status: AC

## 2023-01-31 MED ORDER — CLONAZEPAM 0.5 MG PO TABS: 0.5000 mg | ORAL_TABLET | Freq: Every day | ORAL | 0 refills | Status: AC | PRN
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
## 2023-03-28 ENCOUNTER — Telehealth (HOSPITAL_COMMUNITY): Payer: No Typology Code available for payment source | Admitting: Psychiatry
